# Patient Record
Sex: Female | Born: 1995 | Hispanic: No | Marital: Single | Smoking: Former smoker
Health system: Southern US, Community
[De-identification: ages and names within clinical notes are randomized; demographics above are authoritative.]

## PROBLEM LIST (undated history)

## (undated) DIAGNOSIS — L309 Dermatitis, unspecified: Secondary | ICD-10-CM

## (undated) DIAGNOSIS — I2699 Other pulmonary embolism without acute cor pulmonale: Secondary | ICD-10-CM

## (undated) HISTORY — PX: SHOULDER ARTHROSCOPY: SHX128

---
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
## 2017-09-01 ENCOUNTER — Other Ambulatory Visit: Payer: Self-pay

## 2017-09-01 ENCOUNTER — Emergency Department (HOSPITAL_BASED_OUTPATIENT_CLINIC_OR_DEPARTMENT_OTHER)
Admission: EM | Admit: 2017-09-01 | Discharge: 2017-09-01 | Disposition: A | Discharge status: Home / Self Care | Payer: Worker's Compensation | Attending: Emergency Medicine | Admitting: Emergency Medicine

## 2017-09-01 ENCOUNTER — Encounter (HOSPITAL_BASED_OUTPATIENT_CLINIC_OR_DEPARTMENT_OTHER): Payer: Self-pay | Admitting: Emergency Medicine

## 2017-09-01 ENCOUNTER — Emergency Department (HOSPITAL_BASED_OUTPATIENT_CLINIC_OR_DEPARTMENT_OTHER): Payer: Worker's Compensation

## 2017-09-01 DIAGNOSIS — Z79899 Other long term (current) drug therapy: Secondary | ICD-10-CM | POA: Insufficient documentation

## 2017-09-01 DIAGNOSIS — M79641 Pain in right hand: Secondary | ICD-10-CM

## 2017-09-01 MED ORDER — ACETAMINOPHEN 325 MG PO TABS
650.0000 mg | ORAL_TABLET | Freq: Once | ORAL | Status: AC
  Administered 2017-09-01: 650 mg via ORAL
  Filled 2017-09-01: qty 2

## 2017-09-01 MED ORDER — NAPROXEN 375 MG PO TABS: 375.0000 mg | ORAL_TABLET | Freq: Two times a day (BID) | ORAL | 0 refills | Status: DC

## 2017-09-01 NOTE — ED Triage Notes (Signed)
R hand and wrist pain/swelling since Saturday. No known injury. Pt has velcro splint in place.

## 2017-09-01 NOTE — Discharge Instructions (Signed)
Your x-ray shows no bony abnormality.  Please wear the splint for comfort. Please rest, ice, compress and elevated the affected body part to help with swelling and pain. Please take the Naproxen as prescribed for pain. Do not take any additional NSAIDs including Motrin, Aleve, Ibuprofen, Advil. Follow up with pcp or ortho if symptoms are not improving. Return to the ED if your symptoms worsen.

## 2017-09-01 NOTE — ED Provider Notes (Signed)
[facility] EMERGENCY DEPARTMENT Provider Note   CSN: [id number] Arrival date & time: 09/01/17  1052     History   Chief Complaint Chief Complaint  Patient presents with  . Hand Pain    HPI Kelly Lyons is a 21 y.o. female.  HPI 21 year old female with no pertinent past medical history presents to the emergency department today with complaints of right hand pain and wrist pain.  Patient reports associated swelling.  States that she noticed this today.  Patient denies any known trauma.  She does states that she works and does a lot of repetitive motions with her right hand.  States that the machine at work is broken so she has been doing a lot more by hand stuff.  Patient saw the nurse at the facility who put a splint on her and sent her to the ED for evaluation.  Patient reports some intermittent tingling.  Denies any redness, wound, fevers.  No history of same.  Patient took ibuprofen this morning for the pain.  Palpation of movement makes the pain worse.  States that it is hard to grip something due to the pain. Past Medical History:  Diagnosis Date  . Eczema    on face    There are no active problems to display for this patient.   Past Surgical History:  Procedure Laterality Date  . SHOULDER ARTHROSCOPY  December   For severely dislocated shoulder    OB History    No data available       Home Medications    Prior to Admission medications   Medication Sig Start Date End Date Taking? Authorizing Provider  HYDROcodone-acetaminophen (NORCO) 5-325 MG per tablet Take 1 tablet by mouth every 6 (six) hours as needed. For pain    [provider]  ketorolac (TORADOL) 10 MG tablet Take 10 mg by mouth every 6 (six) hours as needed. For pain    [provider]  medroxyPROGESTERone (DEPO-PROVERA) 150 MG/ML injection Inject 150 mg into the muscle every 3 (three) months.    [provider]  methocarbamol (ROBAXIN) 500 MG tablet Take 500  mg by mouth 2 (two) times daily as needed. For muscle relaxant    [provider]  mometasone (ELOCON) 0.1 % cream Apply 1 application topically daily. Applied to affected areas on body    [provider]  naproxen (NAPROSYN) 375 MG tablet Take 1 tablet (375 mg total) 2 (two) times daily by mouth. 09/01/17   Leaphart, Lynann BeaverKenneth T, PA-C  olopatadine (PATANOL) 0.1 % ophthalmic solution Place 1 drop into both eyes 2 (two) times daily.    [provider]  pimecrolimus (ELIDEL) 1 % cream Apply 1 application topically 2 (two) times daily. Applied to face    [provider]    Family History No family history on file.  Social History Social History   Tobacco Use  . Smoking status: Never Smoker  . Smokeless tobacco: Never Used  Substance Use Topics  . Alcohol use: No  . Drug use: No     Allergies   Patient has no known allergies.   Review of Systems Review of Systems  Musculoskeletal: Positive for arthralgias, joint swelling and myalgias.  Skin: Negative for color change and wound.  Neurological: Negative for weakness and numbness.     Physical Exam Updated Vital Signs BP 107/69 (BP Location: Left Arm)   Pulse 66   Temp 98.4 F (36.9 C) (Oral)   Resp 18   Ht  5' (1.524 m)   Wt 56.1 kg (123 lb 10.9 oz)   SpO2 100%   BMI 24.15 kg/m   Physical Exam  Constitutional: She appears well-developed and well-nourished. No distress.  HENT:  Head: Normocephalic and atraumatic.  Eyes: Right eye exhibits no discharge. Left eye exhibits no discharge. No scleral icterus.  Neck: Normal range of motion.  Pulmonary/Chest: No respiratory distress.  Musculoskeletal: Normal range of motion.  right hand with  tenderness to palpation of the thenar emanance.  No sig swelling appreciated. There is no joint effusion noted. Full ROM without pain. Decreased ROM 2/2 pain. No TTP over flexor sheath. No erythema or warmth overlaying the joint. There is no anatomic snuff  box tenderness. Normal sensation and motor function in the median, ulnar, and radial nerve distributions. 2+ radial pulse.  Grip 5/5 strength. MCP flexion/extension intact. Finger adduction/abduction intact with 5/5 strength.  Thumb opposition intact.   Neurological: She is alert.  Skin: Skin is warm and dry. Capillary refill takes less than 2 seconds. No pallor.  Psychiatric: Her behavior is normal. Judgment and thought content normal.  Nursing note and vitals reviewed.    ED Treatments / Results  Labs (all labs ordered are listed, but only abnormal results are displayed) Labs Reviewed - No data to display  EKG  EKG Interpretation None       Radiology Dg Hand Complete Right  Result Date: 09/01/2017 CLINICAL DATA:  Soft tissue swelling EXAM: RIGHT HAND - COMPLETE 3+ VIEW COMPARISON:  None. FINDINGS: Frontal, oblique, and lateral views were obtained. There is soft tissue swelling along the volar aspect of the hand. No fracture or dislocation. Joint spaces appear normal. No erosive change. IMPRESSION: Soft tissue swelling. No fracture or dislocation. No evident arthropathic change. Electronically Signed   By: Bretta Bang III M.D.   On: 09/01/2017 12:00    Procedures Procedures (including critical care time)  Medications Ordered in ED Medications  acetaminophen (TYLENOL) tablet 650 mg (650 mg Oral Given 09/01/17 1147)     Initial Impression / Assessment and Plan / ED Course  I have reviewed the triage vital signs and the nursing notes.  Pertinent labs & imaging results that were available during my care of the patient were reviewed by me and considered in my medical decision making (see chart for details).     Patient presents to the ED with complaints of right hand swelling and pain.  Patient is neurovascularly intact.  She has no associated erythema or tenderness over the flexor tendon.  I do not appreciate any significant swelling to the hand compared to the left.   Patient has full range of motion.  Patient does a lot of repetitive motions with her hands and could be overuse injury.  I have given her a splint and encouraged rice therapy at home.  Encouraged follow with orthopedics.  Possible carpal tunnel syndrome.  Pt is hemodynamically stable, in NAD, & able to ambulate in the ED. Evaluation does not show pathology that would require ongoing emergent intervention or inpatient treatment. I explained the diagnosis to the patient. Pain has been managed & has no complaints prior to dc. Pt is comfortable with above plan and is stable for discharge at this time. All questions were answered prior to disposition. Strict return precautions for f/u to the ED were discussed. Encouraged follow up with PCP.   Final Clinical Impressions(s) / ED Diagnoses   Final diagnoses:  Right hand pain    ED Discharge Orders  Ordered    naproxen (NAPROSYN) 375 MG tablet  2 times daily     09/01/17 1250       Wallace KellerLeaphart, Kenneth T, PA-C 09/01/17 1304    Mesner, Barbara CowerJason, MD 09/02/17 [phone]

## 2017-11-05 ENCOUNTER — Encounter (HOSPITAL_COMMUNITY): Payer: Self-pay | Admitting: Emergency Medicine

## 2017-11-05 ENCOUNTER — Ambulatory Visit (HOSPITAL_COMMUNITY)
Admission: EM | Admit: 2017-11-05 | Discharge: 2017-11-05 | Disposition: A | Discharge status: Home / Self Care | Payer: BLUE CROSS/BLUE SHIELD | Attending: Internal Medicine | Admitting: Internal Medicine

## 2017-11-05 ENCOUNTER — Other Ambulatory Visit: Payer: Self-pay

## 2017-11-05 DIAGNOSIS — J029 Acute pharyngitis, unspecified: Secondary | ICD-10-CM | POA: Insufficient documentation

## 2017-11-05 DIAGNOSIS — Z79899 Other long term (current) drug therapy: Secondary | ICD-10-CM | POA: Insufficient documentation

## 2017-11-05 LAB — POCT RAPID STREP A: STREPTOCOCCUS, GROUP A SCREEN (DIRECT): NEGATIVE

## 2017-11-05 MED ORDER — CETIRIZINE-PSEUDOEPHEDRINE ER 5-120 MG PO TB12: 1.0000 | ORAL_TABLET | Freq: Every day | ORAL | 0 refills | Status: DC

## 2017-11-05 MED ORDER — BENZONATATE 100 MG PO CAPS: 100.0000 mg | ORAL_CAPSULE | Freq: Three times a day (TID) | ORAL | 0 refills | Status: DC

## 2017-11-05 MED ORDER — FLUTICASONE PROPIONATE 50 MCG/ACT NA SUSP: 2.0000 | Freq: Every day | NASAL | 0 refills | Status: DC

## 2017-11-05 MED ORDER — PHENOL 1.4 % MT LIQD: 1.0000 | OROMUCOSAL | 0 refills | Status: DC | PRN

## 2017-11-05 NOTE — ED Provider Notes (Signed)
[facility]    CSN: [id number] Arrival date & time: 11/05/17  1521     History   Chief Complaint Chief Complaint  Patient presents with  . Sore Throat    HPI Kelly Lyons is a 22 y.o. female.   22 year old female comes in for 1 week history of sore throat. States she was seen at another urgent care with a negative strep, but wanted to be rechecked. She states started out with swollen tonsils with white patches. Has had some productive cough with 1 episode of post tussive vomiting. Denies rhinorrhea, nasal congestion. Denies fever, chills, night sweats. States she was given a dose of oral prednisone in office at the urgent care with some improvement. Has not tried anything else for the symptoms. Denies history of mono. Never smoker.       Past Medical History:  Diagnosis Date  . Eczema    on face    There are no active problems to display for this patient.   Past Surgical History:  Procedure Laterality Date  . SHOULDER ARTHROSCOPY  December   For severely dislocated shoulder    OB History    No data available       Home Medications    Prior to Admission medications   Medication Sig Start Date End Date Taking? Authorizing Provider  benzonatate (TESSALON) 100 MG capsule Take 1 capsule (100 mg total) by mouth every 8 (eight) hours. 11/05/17   Cathie Hoops, Alyissa Whidbee V, PA-C  cetirizine-pseudoephedrine (ZYRTEC-D) 5-120 MG tablet Take 1 tablet by mouth daily. 11/05/17   Cathie Hoops, Daiwik Buffalo V, PA-C  fluticasone (FLONASE) 50 MCG/ACT nasal spray Place 2 sprays into both nostrils daily. 11/05/17   Belinda Fisher, PA-C  HYDROcodone-acetaminophen (NORCO) 5-325 MG per tablet Take 1 tablet by mouth every 6 (six) hours as needed. For pain    [provider]  ketorolac (TORADOL) 10 MG tablet Take 10 mg by mouth every 6 (six) hours as needed. For pain    [provider]  medroxyPROGESTERone (DEPO-PROVERA) 150 MG/ML injection Inject 150 mg into the muscle every 3 (three) months.     [provider]  methocarbamol (ROBAXIN) 500 MG tablet Take 500 mg by mouth 2 (two) times daily as needed. For muscle relaxant    [provider]  mometasone (ELOCON) 0.1 % cream Apply 1 application topically daily. Applied to affected areas on body    [provider]  naproxen (NAPROSYN) 375 MG tablet Take 1 tablet (375 mg total) 2 (two) times daily by mouth. Patient not taking: Reported on 11/05/2017 09/01/17   Demetrios Loll T, PA-C  olopatadine (PATANOL) 0.1 % ophthalmic solution Place 1 drop into both eyes 2 (two) times daily.    [provider]  phenol (CHLORASEPTIC) 1.4 % LIQD Use as directed 1 spray in the mouth or throat as needed for throat irritation / pain. 11/05/17   Cathie Hoops, Kavitha Lansdale V, PA-C  pimecrolimus (ELIDEL) 1 % cream Apply 1 application topically 2 (two) times daily. Applied to face    [provider]    Family History No family history on file.  Social History Social History   Tobacco Use  . Smoking status: Never Smoker  . Smokeless tobacco: Never Used  Substance Use Topics  . Alcohol use: No  . Drug use: No     Allergies   Patient has no known allergies.   Review of Systems Review of Systems  Reason unable to perform ROS: See HPI as  above.     Physical Exam Triage Vital Signs ED Triage Vitals  Enc Vitals Group     BP 11/05/17 1535 (!) 113/56     Pulse Rate 11/05/17 1535 66     Resp 11/05/17 1535 14     Temp 11/05/17 1535 98.5 F (36.9 C)     Temp src --      SpO2 11/05/17 1535 100 %     Weight --      Height --      Head Circumference --      Peak Flow --      Pain Score 11/05/17 1537 7     Pain Loc --      Pain Edu? --      Excl. in GC? --    No data found.  Updated Vital Signs BP (!) 113/56   Pulse 66   Temp 98.5 F (36.9 C)   Resp 14   SpO2 100%   Physical Exam  Constitutional: She is oriented to person, place, and time. She appears well-developed and well-nourished. No distress.  HENT:    Head: Normocephalic and atraumatic.  Right Ear: External ear and ear canal normal. Tympanic membrane is erythematous. Tympanic membrane is not bulging.  Left Ear: External ear and ear canal normal. Tympanic membrane is erythematous. Tympanic membrane is not bulging.  Nose: Nose normal. Right sinus exhibits no maxillary sinus tenderness and no frontal sinus tenderness. Left sinus exhibits no maxillary sinus tenderness and no frontal sinus tenderness.  Mouth/Throat: Uvula is midline and mucous membranes are normal. Posterior oropharyngeal erythema present. Tonsils are 1+ on the right. Tonsils are 1+ on the left. No tonsillar exudate.  Eyes: Conjunctivae are normal. Pupils are equal, round, and reactive to light.  Neck: Normal range of motion. Neck supple.  Cardiovascular: Normal rate, regular rhythm and normal heart sounds. Exam reveals no gallop and no friction rub.  No murmur heard. Pulmonary/Chest: Effort normal and breath sounds normal. She has no decreased breath sounds. She has no wheezes. She has no rhonchi. She has no rales.  Lymphadenopathy:    She has no cervical adenopathy.  Neurological: She is alert and oriented to person, place, and time.  Skin: Skin is warm and dry.  Psychiatric: She has a normal mood and affect. Her behavior is normal. Judgment normal.     UC Treatments / Results  Labs (all labs ordered are listed, but only abnormal results are displayed) Labs Reviewed  CULTURE, GROUP A STREP [facility])  POCT RAPID STREP A    EKG  EKG Interpretation None       Radiology No results found.  Procedures Procedures (including critical care time)  Medications Ordered in UC Medications - No data to display   Initial Impression / Assessment and Plan / UC Course  I have reviewed the triage vital signs and the nursing notes.  Pertinent labs & imaging results that were available during my care of the patient were reviewed by me and considered in my medical decision  making (see chart for details).    Rapid strep negative. Discussed post nasal drainage as possible cause of sore throat. Symptomatic treatment as needed. Return precautions given. Patient expresses understanding and agrees to plan.    Final Clinical Impressions(s) / UC Diagnoses   Final diagnoses:  Acute pharyngitis, unspecified etiology    ED Discharge Orders        Ordered    benzonatate (TESSALON) 100 MG capsule  Every 8 hours  11/05/17 1652    cetirizine-pseudoephedrine (ZYRTEC-D) 5-120 MG tablet  Daily     11/05/17 1652    fluticasone (FLONASE) 50 MCG/ACT nasal spray  Daily     11/05/17 1652    phenol (CHLORASEPTIC) 1.4 % LIQD  As needed     11/05/17 1652        Belinda Fisher, PA-C 11/05/17 1658

## 2017-11-05 NOTE — Discharge Instructions (Signed)
Rapid strep negative. Symptoms are most likely due to viral illness. Start phenol for sore throat. Tessalon for cough. Start flonase, zyrtec-D for nasal congestion. You can use over the counter nasal saline rinse such as neti pot for nasal congestion. Keep hydrated, your urine should be clear to pale yellow in color. Tylenol/motrin for fever and pain. Monitor for any worsening of symptoms, chest pain, shortness of breath, wheezing, swelling of the throat, follow up for reevaluation.  ° °For sore throat try using a honey-based tea. Use 3 teaspoons of honey with juice squeezed from half lemon. Place shaved pieces of ginger into 1/2-1 cup of water and warm over stove top. Then mix the ingredients and repeat every 4 hours as needed. ° °

## 2017-11-05 NOTE — ED Triage Notes (Signed)
Pt c/o sore throat x1 week, had swab done a week ago for strep was neg, states she wants to be checked again.

## 2017-11-07 LAB — CULTURE, GROUP A STREP (THRC)

## 2018-10-23 NOTE — L&D Delivery Note (Signed)
[facility] Delivery Note  Kelly Lyons is a 23 y.o. G1P1s/p SVD at [redacted]w[redacted]d. She was admitted for SOL  ROM: 12h 84m with clear fluid GBS Status: positive   Max  Labor Progress: . Patient presented to L&D for SOL. Initial SVE: 6/90/0. She then progressed to complete.   Delivery Date/Time: SOL Delivery: Called to room and patient was complete and pushing. Head delivered LOA. No nuchal cord present. Shoulder and body delivered in usual fashion. Infant with spontaneous cry, placed on mother's abdomen, dried and stimulated. Cord clamped x 2 after 1-minute delay, and cut by family member. Placenta delivered spontaneously with gentle cord traction. Fundus firm with massage and Pitocin. Labia, perineum, vagina, and cervix inspected inspected with  No lacerations.   Baby Weight: @BABYWGTOZEBC @ pending   Placenta: Sent to L&D Complications: None Lacerations: None EBL: 50 Analgesia: Epidural  Infant: APGAR (1 MIN):   APGAR (5 MINS):   APGAR (10 MINS):     [name], MD, PGY1  [facility] [place] 08/12/2019, 10:32 AM

## 2019-06-26 ENCOUNTER — Emergency Department (HOSPITAL_COMMUNITY): Payer: Medicaid Other

## 2019-06-26 ENCOUNTER — Emergency Department (HOSPITAL_COMMUNITY)
Admission: EM | Admit: 2019-06-26 | Discharge: 2019-06-26 | Disposition: A | Discharge status: Home / Self Care | Payer: Medicaid Other | Attending: Emergency Medicine | Admitting: Emergency Medicine

## 2019-06-26 ENCOUNTER — Other Ambulatory Visit: Payer: Self-pay

## 2019-06-26 ENCOUNTER — Encounter (HOSPITAL_COMMUNITY): Payer: Self-pay | Admitting: *Deleted

## 2019-06-26 DIAGNOSIS — R059 Cough, unspecified: Secondary | ICD-10-CM

## 2019-06-26 DIAGNOSIS — O26893 Other specified pregnancy related conditions, third trimester: Secondary | ICD-10-CM | POA: Insufficient documentation

## 2019-06-26 DIAGNOSIS — R05 Cough: Secondary | ICD-10-CM | POA: Diagnosis not present

## 2019-06-26 DIAGNOSIS — R112 Nausea with vomiting, unspecified: Secondary | ICD-10-CM | POA: Insufficient documentation

## 2019-06-26 DIAGNOSIS — Z20828 Contact with and (suspected) exposure to other viral communicable diseases: Secondary | ICD-10-CM | POA: Insufficient documentation

## 2019-06-26 DIAGNOSIS — J069 Acute upper respiratory infection, unspecified: Secondary | ICD-10-CM | POA: Diagnosis not present

## 2019-06-26 DIAGNOSIS — Z3A30 30 weeks gestation of pregnancy: Secondary | ICD-10-CM | POA: Diagnosis not present

## 2019-06-26 LAB — COMPREHENSIVE METABOLIC PANEL
ALT: 11 U/L (ref 0–44)
AST: 20 U/L (ref 15–41)
Albumin: 3.2 g/dL — ABNORMAL LOW (ref 3.5–5.0)
Alkaline Phosphatase: 163 U/L — ABNORMAL HIGH (ref 38–126)
Anion gap: 14 (ref 5–15)
BUN: <5 mg/dL — ABNORMAL LOW (ref 6–20)
CO2: 18 mmol/L — ABNORMAL LOW (ref 22–32)
Calcium: 9.4 mg/dL (ref 8.9–10.3)
Chloride: 103 mmol/L (ref 98–111)
Creatinine, Ser: 0.73 mg/dL (ref 0.44–1.00)
GFR calc Af Amer: >60 mL/min (ref >60)
GFR calc non Af Amer: >60 mL/min (ref >60)
Glucose, Bld: 69 mg/dL — ABNORMAL LOW (ref 70–99)
Potassium: 3.5 mmol/L (ref 3.5–5.1)
Sodium: 135 mmol/L (ref 135–145)
Total Bilirubin: 0.5 mg/dL (ref 0.3–1.2)
Total Protein: 7.5 g/dL (ref 6.5–8.1)

## 2019-06-26 LAB — CBC WITH DIFFERENTIAL/PLATELET
Abs Immature Granulocytes: 0.05 10*3/uL (ref 0.00–0.07)
Basophils Absolute: 0 10*3/uL (ref 0.0–0.1)
Basophils Relative: 0 %
Eosinophils Absolute: 0.3 10*3/uL (ref 0.0–0.5)
Eosinophils Relative: 2 %
HCT: 37 % (ref 36.0–46.0)
Hemoglobin: 13.3 g/dL (ref 12.0–15.0)
Immature Granulocytes: 0 %
Lymphocytes Relative: 15 %
Lymphs Abs: 2.1 10*3/uL (ref 0.7–4.0)
MCH: 28.9 pg (ref 26.0–34.0)
MCHC: 35.9 g/dL (ref 30.0–36.0)
MCV: 80.4 fL (ref 80.0–100.0)
Monocytes Absolute: 1 10*3/uL (ref 0.1–1.0)
Monocytes Relative: 7 %
Neutro Abs: 10.7 10*3/uL — ABNORMAL HIGH (ref 1.7–7.7)
Neutrophils Relative %: 76 %
Platelets: 276 10*3/uL (ref 150–400)
RBC: 4.6 MIL/uL (ref 3.87–5.11)
RDW: 12 % (ref 11.5–15.5)
WBC: 14.2 10*3/uL — ABNORMAL HIGH (ref 4.0–10.5)
nRBC: 0 % (ref 0.0–0.2)

## 2019-06-26 LAB — URINALYSIS, ROUTINE W REFLEX MICROSCOPIC
Bilirubin Urine: NEGATIVE
Glucose, UA: NEGATIVE mg/dL
Hgb urine dipstick: NEGATIVE
Ketones, ur: 80 mg/dL — AB
Nitrite: NEGATIVE
Protein, ur: 30 mg/dL — AB
Specific Gravity, Urine: 1.025 (ref 1.005–1.030)
pH: 6 (ref 5.0–8.0)

## 2019-06-26 LAB — LIPASE, BLOOD: Lipase: 29 U/L (ref 11–51)

## 2019-06-26 LAB — LACTIC ACID, PLASMA
Lactic Acid, Venous: 1.1 mmol/L (ref 0.5–1.9)
Lactic Acid, Venous: 1.2 mmol/L (ref 0.5–1.9)

## 2019-06-26 LAB — SARS CORONAVIRUS 2 BY RT PCR (HOSPITAL ORDER, PERFORMED IN ~~LOC~~ HOSPITAL LAB): SARS Coronavirus 2: NEGATIVE

## 2019-06-26 LAB — CBG MONITORING, ED: Glucose-Capillary: 68 mg/dL — ABNORMAL LOW (ref 70–99)

## 2019-06-26 LAB — GROUP A STREP BY PCR: Group A Strep by PCR: NOT DETECTED

## 2019-06-26 MED ORDER — ALBUTEROL SULFATE HFA 108 (90 BASE) MCG/ACT IN AERS
4.0000 | INHALATION_SPRAY | Freq: Once | RESPIRATORY_TRACT | Status: AC
  Administered 2019-06-26: 17:00:00 4 via RESPIRATORY_TRACT
  Filled 2019-06-26: qty 6.7

## 2019-06-26 MED ORDER — FAMOTIDINE 20 MG PO TABS: 20.0000 mg | ORAL_TABLET | Freq: Two times a day (BID) | ORAL | 0 refills | Status: DC

## 2019-06-26 MED ORDER — SODIUM CHLORIDE 0.9 % IV SOLN
1.0000 g | Freq: Once | INTRAVENOUS | Status: AC
  Administered 2019-06-26: 1 g via INTRAVENOUS
  Filled 2019-06-26: qty 10

## 2019-06-26 MED ORDER — METOCLOPRAMIDE HCL 5 MG PO TABS: 5.0000 mg | ORAL_TABLET | Freq: Four times a day (QID) | ORAL | 0 refills | Status: DC

## 2019-06-26 MED ORDER — DIPHENHYDRAMINE HCL 25 MG PO TABS: 25.0000 mg | ORAL_TABLET | Freq: Four times a day (QID) | ORAL | 0 refills | Status: DC

## 2019-06-26 MED ORDER — SODIUM CHLORIDE 0.9 % IV BOLUS (SEPSIS): 250.0000 mL | Freq: Once | INTRAVENOUS | Status: DC

## 2019-06-26 MED ORDER — FAMOTIDINE IN NACL 20-0.9 MG/50ML-% IV SOLN
20.0000 mg | Freq: Once | INTRAVENOUS | Status: AC
  Administered 2019-06-26: 20 mg via INTRAVENOUS
  Filled 2019-06-26: qty 50

## 2019-06-26 MED ORDER — DIPHENHYDRAMINE HCL 50 MG/ML IJ SOLN
25.0000 mg | Freq: Once | INTRAMUSCULAR | Status: AC
  Administered 2019-06-26: 25 mg via INTRAVENOUS
  Filled 2019-06-26: qty 1

## 2019-06-26 MED ORDER — ACETAMINOPHEN 500 MG PO TABS
1000.0000 mg | ORAL_TABLET | Freq: Once | ORAL | Status: AC
  Administered 2019-06-26: 1000 mg via ORAL
  Filled 2019-06-26: qty 2

## 2019-06-26 MED ORDER — SODIUM CHLORIDE 0.9 % IV SOLN: 1000.0000 mL | INTRAVENOUS | Status: DC

## 2019-06-26 MED ORDER — METOCLOPRAMIDE HCL 5 MG/ML IJ SOLN
5.0000 mg | Freq: Once | INTRAMUSCULAR | Status: AC
  Administered 2019-06-26: 5 mg via INTRAVENOUS
  Filled 2019-06-26: qty 2

## 2019-06-26 MED ORDER — SODIUM CHLORIDE 0.9 % IV BOLUS
1000.0000 mL | Freq: Once | INTRAVENOUS | Status: AC
  Administered 2019-06-26: 1000 mL via INTRAVENOUS

## 2019-06-26 MED ORDER — LACTATED RINGERS IV BOLUS
1000.0000 mL | Freq: Once | INTRAVENOUS | Status: AC
  Administered 2019-06-26: 1000 mL via INTRAVENOUS

## 2019-06-26 MED ORDER — CEPHALEXIN 500 MG PO CAPS: 1000.0000 mg | ORAL_CAPSULE | Freq: Two times a day (BID) | ORAL | 0 refills | Status: DC

## 2019-06-26 NOTE — ED Notes (Signed)
CBG noted to be 68. OJ and [place] sandwich given to pt and RN notified.

## 2019-06-26 NOTE — Discharge Instructions (Addendum)
1.  Start Keflex tomorrow evening. 2.  Take Pepcid twice daily and Reglan every 6 hours with Benadryl for nausea control for the next 24 hours then if needed. 3.  Continue to drink frequent small amount of fluids.  Try to eat very small amounts of food with some salt and sugar in it.  Eat bland foods and avoid things that trigger nausea. 4.  See your obstetrician as soon as possible for recheck. 5.  Return to the emergency department if symptoms return or worsen.

## 2019-06-26 NOTE — ED Triage Notes (Addendum)
Pt reports recent cough and cold symptoms. Reports cough, sore throat, headache, n/v. Sent here to r/o covid. Pt is approx [redacted] weeks pregnant.

## 2019-06-26 NOTE — ED Notes (Signed)
States she is 30 wks and 2 days was suppose to see her OB today however co nasal congestion , headache , vomiting for several days , c/o cough when she takes a deep breath, c/o sore throat.

## 2019-06-26 NOTE — ED Provider Notes (Signed)
[facility] Provider Note   CSN: [id number] Arrival date & time: 06/26/19  1208     History   Chief Complaint Chief Complaint  Patient presents with  . Cough  . Emesis  . Sore Throat    HPI Kelly Lyons is a 23 y.o. female.     HPI Patient is [redacted] weeks pregnant.  She reports that for the past 2 to 3 days now she has been getting a cough and congestion.  First symptom was sore throat.  That then progressed to have nasal congestion postnasal drainage followed by very harsh coughing.  Patient reports that she takes a deep breath she will go into a coughing episode that she cannot stop.  Sometimes is causing gagging and vomiting.  She reports when she coughs she feels achy around the lower ribs on both sides.  She reports she has vomited several times.  No diarrhea.  No abdominal pain.  No vaginal discharge or bleeding.  No pain burning urgency with urination.  She reports she does feel generally achy and fatigued.  No fever that she is documented.  Patient reports she is otherwise healthy.  She does has no history of asthma or other respiratory illnesses.  Patient reports that she had called her doctor about her symptoms today and they instructed her to come to the emergency department for COVID test.  She is also concerned because she really has not build to eat or drink for several days and worried that she is becoming dehydrated and that will negatively impact her pregnancy. Past Medical History:  Diagnosis Date  . Eczema    on face    There are no active problems to display for this patient.   Past Surgical History:  Procedure Laterality Date  . SHOULDER ARTHROSCOPY  December   For severely dislocated shoulder     OB History    Gravida  1   Para      Term      Preterm      AB      Living        SAB      TAB      Ectopic      Multiple      Live Births               Home Medications    Prior to Admission  medications   Medication Sig Start Date End Date Taking? Authorizing Provider  Prenatal 27-1 MG TABS Take 1 tablet by mouth daily. 03/28/19  Yes [provider]  benzonatate (TESSALON) 100 MG capsule Take 1 capsule (100 mg total) by mouth every 8 (eight) hours. Patient not taking: Reported on 06/26/2019 11/05/17   Belinda FisherYu, Amy V, PA-C  cephALEXin (KEFLEX) 500 MG capsule Take 2 capsules (1,000 mg total) by mouth 2 (two) times daily. 06/26/19   Arby BarrettePfeiffer, Krew Hortman, MD  cetirizine-pseudoephedrine (ZYRTEC-D) 5-120 MG tablet Take 1 tablet by mouth daily. Patient not taking: Reported on 06/26/2019 11/05/17   Belinda FisherYu, Amy V, PA-C  diphenhydrAMINE (BENADRYL) 25 MG tablet Take 1 tablet (25 mg total) by mouth every 6 (six) hours. 06/26/19   Arby BarrettePfeiffer, Leone Mobley, MD  famotidine (PEPCID) 20 MG tablet Take 1 tablet (20 mg total) by mouth 2 (two) times daily. 06/26/19   Arby BarrettePfeiffer, Stanly Si, MD  fluticasone (FLONASE) 50 MCG/ACT nasal spray Place 2 sprays into both nostrils daily. Patient not taking: Reported on 06/26/2019 11/05/17   Belinda FisherYu, Amy V, PA-C  metoCLOPramide (  REGLAN) 5 MG tablet Take 1 tablet (5 mg total) by mouth 4 (four) times daily. 06/26/19   Arby BarrettePfeiffer, Lucianna Ostlund, MD  naproxen (NAPROSYN) 375 MG tablet Take 1 tablet (375 mg total) 2 (two) times daily by mouth. Patient not taking: Reported on 11/05/2017 09/01/17   Demetrios LollLeaphart, Kenneth T, PA-C  phenol (CHLORASEPTIC) 1.4 % LIQD Use as directed 1 spray in the mouth or throat as needed for throat irritation / pain. Patient not taking: Reported on 06/26/2019 11/05/17   Lurline IdolYu, Amy V, PA-C    Family History No family history on file.  Social History Social History   Tobacco Use  . Smoking status: Never Smoker  . Smokeless tobacco: Never Used  Substance Use Topics  . Alcohol use: No  . Drug use: No     Allergies   Patient has no known allergies.   Review of Systems Review of Systems 10 Systems reviewed and are negative for acute change except as noted in the HPI.   Physical Exam  Updated Vital Signs BP (!) 96/51 (BP Location: Right Arm)   Pulse 82   Temp 97.8 F (36.6 C) (Oral)   Resp 18   LMP 11/27/2018   SpO2 97%   Physical Exam Constitutional:      Appearance: Normal appearance.  HENT:     Head: Normocephalic and atraumatic.     Mouth/Throat:     Mouth: Mucous membranes are moist.     Pharynx: Oropharynx is clear.     Comments: Very mild erythema of the tonsillar pillars.  No exudate or significant tonsillar enlargement. Eyes:     Extraocular Movements: Extraocular movements intact.     Conjunctiva/sclera: Conjunctivae normal.  Neck:     Musculoskeletal: Neck supple.  Cardiovascular:     Rate and Rhythm: Normal rate and regular rhythm.  Pulmonary:     Comments: No respiratory distress.  Patient is reluctant however to take deep inspiration because this precipitates cough paroxysmal.  Cough is harsh and dry.  Good airflow to the bases.  No gross rales or rhonchi. Abdominal:     Comments: Abdomen is gravid and nontender.  Musculoskeletal: Normal range of motion.        General: No swelling or tenderness.     Right lower leg: No edema.     Left lower leg: No edema.  Lymphadenopathy:     Cervical: No cervical adenopathy.  Skin:    General: Skin is warm and dry.     Findings: No rash.  Neurological:     General: No focal deficit present.     Mental Status: She is alert and oriented to person, place, and time.     Coordination: Coordination normal.  Psychiatric:        Mood and Affect: Mood normal.      ED Treatments / Results  Labs (all labs ordered are listed, but only abnormal results are displayed) Labs Reviewed  COMPREHENSIVE METABOLIC PANEL - Abnormal; Notable for the following components:      Result Value   CO2 18 (*)    Glucose, Bld 69 (*)    BUN <5 (*)    Albumin 3.2 (*)    Alkaline Phosphatase 163 (*)    All other components within normal limits  CBC WITH DIFFERENTIAL/PLATELET - Abnormal; Notable for the following  components:   WBC 14.2 (*)    Neutro Abs 10.7 (*)    All other components within normal limits  URINALYSIS, ROUTINE W REFLEX MICROSCOPIC - Abnormal;  Notable for the following components:   APPearance HAZY (*)    Ketones, ur 80 (*)    Protein, ur 30 (*)    Leukocytes,Ua MODERATE (*)    Bacteria, UA FEW (*)    All other components within normal limits  CBG MONITORING, ED - Abnormal; Notable for the following components:   Glucose-Capillary 68 (*)    All other components within normal limits  SARS CORONAVIRUS 2 (HOSPITAL ORDER, [facility])  GROUP A STREP BY PCR  URINE CULTURE  LIPASE, BLOOD  LACTIC ACID, PLASMA  LACTIC ACID, PLASMA    EKG None  Radiology Dg Chest Port 1 View  Result Date: 06/26/2019 CLINICAL DATA:  Cough, shortness of breath. EXAM: PORTABLE CHEST 1 VIEW COMPARISON:  Radiographs of December 04, 2011. FINDINGS: The heart size and mediastinal contours are within normal limits. Both lungs are clear. No pneumothorax or pleural effusion is noted. The visualized skeletal structures are unremarkable. IMPRESSION: No active disease. Electronically Signed   By: Marijo Conception M.D.   On: 06/26/2019 15:45    Procedures Procedures (including critical care time) Angiocath insertion Performed by: Charlesetta Shanks  Consent: Verbal consent obtained. Risks and benefits: risks, benefits and alternatives were discussed Time out: Immediately prior to procedure a "time out" was called to verify the correct patient, procedure, equipment, support staff and site/side marked as required.  Preparation: Patient was prepped and draped in the usual sterile fashion.  Vein Location: left basilic  Yes Ultrasound Guided  Gauge: 20  Normal blood return and flush without difficulty Patient tolerance: Patient tolerated the procedure well with no immediate complications.   Medications Ordered in ED Medications  acetaminophen (TYLENOL) tablet 1,000 mg (1,000 mg  Oral Given 06/26/19 1544)  albuterol (VENTOLIN HFA) 108 (90 Base) MCG/ACT inhaler 4 puff (4 puffs Inhalation Given 06/26/19 1647)  sodium chloride 0.9 % bolus 1,000 mL (0 mLs Intravenous Stopped 06/26/19 2113)  lactated ringers bolus 1,000 mL (0 mLs Intravenous Stopped 06/26/19 2232)  famotidine (PEPCID) IVPB 20 mg premix (0 mg Intravenous Stopped 06/26/19 2113)  diphenhydrAMINE (BENADRYL) injection 25 mg (25 mg Intravenous Given 06/26/19 1953)  metoCLOPramide (REGLAN) injection 5 mg (5 mg Intravenous Given 06/26/19 1953)  cefTRIAXone (ROCEPHIN) 1 g in sodium chloride 0.9 % 100 mL IVPB (0 g Intravenous Stopped 06/26/19 2315)     Initial Impression / Assessment and Plan / ED Course  I have reviewed the triage vital signs and the nursing notes.  Pertinent labs & imaging results that were available during my care of the patient were reviewed by me and considered in my medical decision making (see chart for details).  Clinical Course as of Jun 26 2323  Thu Jun 26, 2019  1758 Delay in fluid administration due to nursing difficulty in establishing peripheral IV.  I have placed ultrasound-guided IV with good blood return and flush.  Will initiate hydration.   [MP]  2233 Patient is feeling much better.  She is alert and comfortable.  She has had no further nausea or vomiting.  Has been able to tolerate small amount of applesauce and crackers and fluids.  She reports she is been up to go the bathroom multiple times.  She has not felt weak or dizzy.   [MP]    Clinical Course User Index [MP] Charlesetta Shanks, MD      Patient presented with upper respiratory symptoms consistent with viral illness.  She has tested negative for COVID and the chest x-ray is  clear.  At this time I do not suspect bacterial pneumonia.  Patient had good response to albuterol inhaler as far as coughing was concerned.  Nausea was treated with Reglan, Benadryl and Pepcid.  Patient was hydrated and improved significantly.  She has been able to  tolerate oral intake and has been up and ambulatory without difficulty.  At this time I do feel she is stable for discharge.  She will use her albuterol inhaler as provided, take Keflex for urine that is suggestive of UTI and Reglan and Benadryl.  Final Clinical Impressions(s) / ED Diagnoses   Final diagnoses:  Viral upper respiratory tract infection  Cough  Non-intractable vomiting with nausea, unspecified vomiting type  [redacted] weeks gestation of pregnancy    ED Discharge Orders         Ordered    cephALEXin (KEFLEX) 500 MG capsule  2 times daily     06/26/19 2319    famotidine (PEPCID) 20 MG tablet  2 times daily     06/26/19 2319    metoCLOPramide (REGLAN) 5 MG tablet  4 times daily     06/26/19 2319    diphenhydrAMINE (BENADRYL) 25 MG tablet  Every 6 hours     06/26/19 2319           Arby Barrette, MD 06/29/19 1357

## 2019-06-27 LAB — URINE CULTURE

## 2019-08-02 ENCOUNTER — Inpatient Hospital Stay (HOSPITAL_BASED_OUTPATIENT_CLINIC_OR_DEPARTMENT_OTHER): Payer: Medicaid Other

## 2019-08-02 ENCOUNTER — Inpatient Hospital Stay (HOSPITAL_COMMUNITY)
Admission: EM | Admit: 2019-08-02 | Discharge: 2019-08-06 | Disposition: A | Discharge status: Home / Self Care | Payer: Medicaid Other | Attending: Obstetrics and Gynecology | Admitting: Obstetrics and Gynecology

## 2019-08-02 ENCOUNTER — Encounter (HOSPITAL_COMMUNITY): Payer: Self-pay

## 2019-08-02 ENCOUNTER — Other Ambulatory Visit: Payer: Self-pay

## 2019-08-02 DIAGNOSIS — Z3A35 35 weeks gestation of pregnancy: Secondary | ICD-10-CM | POA: Insufficient documentation

## 2019-08-02 DIAGNOSIS — O4693 Antepartum hemorrhage, unspecified, third trimester: Secondary | ICD-10-CM

## 2019-08-02 DIAGNOSIS — O368130 Decreased fetal movements, third trimester, not applicable or unspecified: Secondary | ICD-10-CM | POA: Diagnosis not present

## 2019-08-02 DIAGNOSIS — Z20828 Contact with and (suspected) exposure to other viral communicable diseases: Secondary | ICD-10-CM | POA: Diagnosis not present

## 2019-08-02 DIAGNOSIS — O368190 Decreased fetal movements, unspecified trimester, not applicable or unspecified: Secondary | ICD-10-CM

## 2019-08-02 DIAGNOSIS — O368131 Decreased fetal movements, third trimester, fetus 1: Secondary | ICD-10-CM

## 2019-08-02 DIAGNOSIS — N930 Postcoital and contact bleeding: Secondary | ICD-10-CM

## 2019-08-02 LAB — CBC
HCT: 35.5 % — ABNORMAL LOW (ref 36.0–46.0)
Hemoglobin: 13 g/dL (ref 12.0–15.0)
MCH: 29.5 pg (ref 26.0–34.0)
MCHC: 36.6 g/dL — ABNORMAL HIGH (ref 30.0–36.0)
MCV: 80.5 fL (ref 80.0–100.0)
Platelets: 244 10*3/uL (ref 150–400)
RBC: 4.41 MIL/uL (ref 3.87–5.11)
RDW: 12.9 % (ref 11.5–15.5)
WBC: 9.4 10*3/uL (ref 4.0–10.5)
nRBC: 0 % (ref 0.0–0.2)

## 2019-08-02 LAB — WET PREP, GENITAL
Clue Cells Wet Prep HPF POC: NONE SEEN
Sperm: NONE SEEN
Trich, Wet Prep: NONE SEEN
Yeast Wet Prep HPF POC: NONE SEEN

## 2019-08-02 LAB — ABO/RH: ABO/RH(D): AB POS

## 2019-08-02 LAB — RAPID URINE DRUG SCREEN, HOSP PERFORMED
Amphetamines: NOT DETECTED
Barbiturates: NOT DETECTED
Benzodiazepines: NOT DETECTED
Cocaine: NOT DETECTED
Opiates: NOT DETECTED
Tetrahydrocannabinol: POSITIVE — AB

## 2019-08-02 LAB — TYPE AND SCREEN
ABO/RH(D): AB POS
Antibody Screen: NEGATIVE

## 2019-08-02 LAB — SARS CORONAVIRUS 2 (TAT 6-24 HRS): SARS Coronavirus 2: NEGATIVE

## 2019-08-02 MED ORDER — TERBUTALINE SULFATE 1 MG/ML IJ SOLN
0.2500 mg | Freq: Once | INTRAMUSCULAR | Status: AC
  Administered 2019-08-02: 0.25 mg via SUBCUTANEOUS
  Filled 2019-08-02: qty 1

## 2019-08-02 MED ORDER — LACTATED RINGERS IV SOLN
INTRAVENOUS | Status: DC
  Administered 2019-08-02 – 2019-08-03 (×2): via INTRAVENOUS

## 2019-08-02 MED ORDER — LACTATED RINGERS IV BOLUS
1000.0000 mL | Freq: Once | INTRAVENOUS | Status: AC
  Administered 2019-08-02: 15:00:00 1000 mL via INTRAVENOUS

## 2019-08-02 MED ORDER — LACTATED RINGERS IV SOLN
INTRAVENOUS | Status: DC
  Administered 2019-08-02: 16:00:00 via INTRAVENOUS

## 2019-08-02 MED ORDER — BETAMETHASONE SOD PHOS & ACET 6 (3-3) MG/ML IJ SUSP
12.0000 mg | Freq: Once | INTRAMUSCULAR | Status: AC
  Administered 2019-08-03: 12 mg via INTRAMUSCULAR
  Filled 2019-08-02: qty 2

## 2019-08-02 MED ORDER — DOCUSATE SODIUM 100 MG PO CAPS
100.0000 mg | ORAL_CAPSULE | Freq: Every day | ORAL | Status: DC
  Administered 2019-08-02 – 2019-08-06 (×4): 100 mg via ORAL
  Filled 2019-08-02 (×4): qty 1

## 2019-08-02 MED ORDER — BETAMETHASONE SOD PHOS & ACET 6 (3-3) MG/ML IJ SUSP
12.0000 mg | Freq: Once | INTRAMUSCULAR | Status: AC
  Administered 2019-08-02: 16:00:00 12 mg via INTRAMUSCULAR
  Filled 2019-08-02: qty 2

## 2019-08-02 MED ORDER — PRENATAL MULTIVITAMIN CH
1.0000 | ORAL_TABLET | Freq: Every day | ORAL | Status: DC
  Administered 2019-08-03 – 2019-08-06 (×4): 1 via ORAL
  Filled 2019-08-02 (×4): qty 1

## 2019-08-02 MED ORDER — ZOLPIDEM TARTRATE 5 MG PO TABS
5.0000 mg | ORAL_TABLET | Freq: Every evening | ORAL | Status: DC | PRN
  Administered 2019-08-02 – 2019-08-05 (×4): 5 mg via ORAL
  Filled 2019-08-02 (×4): qty 1

## 2019-08-02 MED ORDER — ACETAMINOPHEN 325 MG PO TABS
650.0000 mg | ORAL_TABLET | ORAL | Status: DC | PRN
  Administered 2019-08-04: 650 mg via ORAL
  Filled 2019-08-02: qty 2

## 2019-08-02 MED ORDER — CALCIUM CARBONATE ANTACID 500 MG PO CHEW: 2.0000 | CHEWABLE_TABLET | ORAL | Status: DC | PRN

## 2019-08-02 NOTE — MAU Note (Signed)
Kelly Lyons is a 23 y.o. at [redacted]w[redacted]d here in MAU reporting: states after intercourse she had some heavy bleeding, it pooled on the floor, dripped down her leg, and went in the toilet. . Also having some abdominal pain and reports that she has not felt baby move since the episode of bleeding.   Onset of complaint: today  Pain score: 7/10  Vitals:   08/02/19 1425  BP: 127/82  Pulse: 94  Resp: 18  Temp: 99 F (37.2 C)  SpO2: 98%      Lab orders placed from triage: none

## 2019-08-02 NOTE — H&P (Signed)
HPI   [name] is a 23 y.o. female G1P0 @ [redacted]w[redacted]d here in MAU complaining of heavy vaginal bleeding and abdominal cramping that started during intercourse today around 1300. She felt a large gush and then noticed blood leaking down to her legs. She immediatly came to the hospital and presented to [facility] ED. She was transferred here by ED Staff. She complains of decreased fetal movement since the episode of bleeding. She is receiving her care at the HD. She has had no problems in this pregnancy.           OB History    Gravida  1   Para      Term      Preterm      AB      Living        SAB      TAB      Ectopic      Multiple      Live Births                  Past Medical History:  Diagnosis Date  . Eczema    on face         Past Surgical History:  Procedure Laterality Date  . SHOULDER ARTHROSCOPY  December   For severely dislocated shoulder    History reviewed. No pertinent family history.  Social History       Tobacco Use  . Smoking status: Never Smoker  . Smokeless tobacco: Never Used  Substance Use Topics  . Alcohol use: No  . Drug use: No    Allergies: No Known Allergies  Medications Prior to Admission  Medication Sig Dispense Refill Last Dose  . benzonatate (TESSALON) 100 MG capsule Take 1 capsule (100 mg total) by mouth every 8 (eight) hours. (Patient not taking: Reported on 06/26/2019) 21 capsule 0   . cephALEXin (KEFLEX) 500 MG capsule Take 2 capsules (1,000 mg total) by mouth 2 (two) times daily. 28 capsule 0   . cetirizine-pseudoephedrine (ZYRTEC-D) 5-120 MG tablet Take 1 tablet by mouth daily. (Patient not taking: Reported on 06/26/2019) 15 tablet 0   . diphenhydrAMINE (BENADRYL) 25 MG tablet Take 1 tablet (25 mg total) by mouth every 6 (six) hours. 20 tablet 0   . famotidine (PEPCID) 20 MG tablet Take 1 tablet (20 mg total) by mouth 2 (two) times daily. 30 tablet 0   . fluticasone (FLONASE) 50 MCG/ACT nasal  spray Place 2 sprays into both nostrils daily. (Patient not taking: Reported on 06/26/2019) 1 g 0   . metoCLOPramide (REGLAN) 5 MG tablet Take 1 tablet (5 mg total) by mouth 4 (four) times daily. 30 tablet 0   . naproxen (NAPROSYN) 375 MG tablet Take 1 tablet (375 mg total) 2 (two) times daily by mouth. (Patient not taking: Reported on 11/05/2017) 20 tablet 0   . phenol (CHLORASEPTIC) 1.4 % LIQD Use as directed 1 spray in the mouth or throat as needed for throat irritation / pain. (Patient not taking: Reported on 06/26/2019) 1 Bottle 0   . Prenatal 27-1 MG TABS Take 1 tablet by mouth daily.      Lab Results Last 48 Hours        Results for orders placed or performed during the hospital encounter of 08/02/19 (from the past 48 hour(s))  Wet prep, genital     Status: Abnormal   Collection Time: 08/02/19  2:55 PM   Specimen: PATH Cytology Cervicovaginal Ancillary Only  Result Value Ref  Range   Yeast Wet Prep HPF POC NONE SEEN NONE SEEN   Trich, Wet Prep NONE SEEN NONE SEEN   Clue Cells Wet Prep HPF POC NONE SEEN NONE SEEN   WBC, Wet Prep HPF POC FEW (A) NONE SEEN   Sperm NONE SEEN     Comment: Performed at [facility], [address] [place], [place] [address]     Review of Systems  Gastrointestinal: Positive for abdominal pain.  Genitourinary: Positive for vaginal bleeding.   Physical Exam   Blood pressure 127/82, pulse 94, temperature 99 F (37.2 C), temperature source Oral, resp. rate 18, last menstrual period 11/27/2018, SpO2 98 %.  Physical Exam  Constitutional: She is oriented to person, place, and time. She appears well-developed and well-nourished.  HENT:  Head: Normocephalic.  GI: Soft.  Genitourinary:    Genitourinary Comments: Vagina - moderate amount of bright red blood in the vault, enough to saturate 2-3 large swabs.   Cervix - + oozing of blood from the cervix.  Bimanual exam: Cervix: Dilation: Fingertip Effacement (%): Thick Cervical  Position: Anterior Exam by:: rasch np Chaperone present for exam.    Musculoskeletal: Normal range of motion.  Neurological: She is alert and oriented to person, place, and time.   FHR- 130s reassuring CTX- irregular  A/P. Bleeding during pregnancy at 35.[redacted] weeks EGA BMZ given in the MAU and will be given again tomorrow She will be admitted for 7 days

## 2019-08-02 NOTE — MAU Note (Signed)
U/s at bedside

## 2019-08-02 NOTE — MAU Provider Note (Signed)
History     CSN: [id number]  Arrival date and time: 08/02/19 1412   First Provider Initiated Contact with Patient 08/02/19 1445      Chief Complaint  Patient presents with  . Abdominal Pain  . Vaginal Bleeding  . Decreased Fetal Movement   HPI   [name] is a 23 y.o. female G1P0 @ [redacted]w[redacted]d here in MAU complaining of heavy vaginal bleeding and abdominal cramping that started during intercourse today around 1300. She felt a large gush and then noticed blood leaking down to her legs. She immediatly came to the hospital and presented to [facility] ED. She was transferred here by ED Staff. She complains of decreased fetal movement since the episode of bleeding. She is receiving her care at the HD. She has had no problems in this pregnancy.    Of note: dried, bright red blood noted on patient's feet upon arrival.   OB History    Gravida  1   Para      Term      Preterm      AB      Living        SAB      TAB      Ectopic      Multiple      Live Births              Past Medical History:  Diagnosis Date  . Eczema    on face    Past Surgical History:  Procedure Laterality Date  . SHOULDER ARTHROSCOPY  December   For severely dislocated shoulder    History reviewed. No pertinent family history.  Social History   Tobacco Use  . Smoking status: Never Smoker  . Smokeless tobacco: Never Used  Substance Use Topics  . Alcohol use: No  . Drug use: No    Allergies: No Known Allergies  Medications Prior to Admission  Medication Sig Dispense Refill Last Dose  . benzonatate (TESSALON) 100 MG capsule Take 1 capsule (100 mg total) by mouth every 8 (eight) hours. (Patient not taking: Reported on 06/26/2019) 21 capsule 0   . cephALEXin (KEFLEX) 500 MG capsule Take 2 capsules (1,000 mg total) by mouth 2 (two) times daily. 28 capsule 0   . cetirizine-pseudoephedrine (ZYRTEC-D) 5-120 MG tablet Take 1 tablet by mouth daily. (Patient not taking: Reported on  06/26/2019) 15 tablet 0   . diphenhydrAMINE (BENADRYL) 25 MG tablet Take 1 tablet (25 mg total) by mouth every 6 (six) hours. 20 tablet 0   . famotidine (PEPCID) 20 MG tablet Take 1 tablet (20 mg total) by mouth 2 (two) times daily. 30 tablet 0   . fluticasone (FLONASE) 50 MCG/ACT nasal spray Place 2 sprays into both nostrils daily. (Patient not taking: Reported on 06/26/2019) 1 g 0   . metoCLOPramide (REGLAN) 5 MG tablet Take 1 tablet (5 mg total) by mouth 4 (four) times daily. 30 tablet 0   . naproxen (NAPROSYN) 375 MG tablet Take 1 tablet (375 mg total) 2 (two) times daily by mouth. (Patient not taking: Reported on 11/05/2017) 20 tablet 0   . phenol (CHLORASEPTIC) 1.4 % LIQD Use as directed 1 spray in the mouth or throat as needed for throat irritation / pain. (Patient not taking: Reported on 06/26/2019) 1 Bottle 0   . Prenatal 27-1 MG TABS Take 1 tablet by mouth daily.      Results for orders placed or performed during the hospital encounter of 08/02/19 (from the past  48 hour(s))  Wet prep, genital     Status: Abnormal   Collection Time: 08/02/19  2:55 PM   Specimen: PATH Cytology Cervicovaginal Ancillary Only  Result Value Ref Range   Yeast Wet Prep HPF POC NONE SEEN NONE SEEN   Trich, Wet Prep NONE SEEN NONE SEEN   Clue Cells Wet Prep HPF POC NONE SEEN NONE SEEN   WBC, Wet Prep HPF POC FEW (A) NONE SEEN   Sperm NONE SEEN     Comment: Performed at [facility], [address] [place], [place] [address]  Type and screen [facility]     Status: None (Preliminary result)   Collection Time: 08/02/19  3:05 PM  Result Value Ref Range   ABO/RH(D) PENDING    Antibody Screen PENDING    Sample Expiration      08/05/2019,2359 Performed at [facility], [address] [place], [place] [address]    Review of Systems  Gastrointestinal: Positive for abdominal pain.  Genitourinary: Positive for vaginal bleeding.   Physical Exam   Blood pressure 127/82, pulse 94,  temperature 99 F (37.2 C), temperature source Oral, resp. rate 18, last menstrual period 11/27/2018, SpO2 98 %.  Physical Exam  Constitutional: She is oriented to person, place, and time. She appears well-developed and well-nourished.  HENT:  Head: Normocephalic.  GI: Soft. There is generalized abdominal tenderness.  Genitourinary:    Genitourinary Comments: Vagina - moderate amount of bright red blood in the vault, enough to saturate 2-3 large swabs.   Cervix - + oozing of blood from the cervix.  Bimanual exam: Cervix: Dilation: Fingertip Effacement (%): Thick Cervical Position: Anterior Exam by:: Harry Bark np Chaperone present for exam.    Musculoskeletal: Normal range of motion.  Neurological: She is alert and oriented to person, place, and time.   MAU Course  Procedures  None  MDM  AB positive blood type  [place] and BPP at bedside; BPP 8/8 LR bolus Betamethasone given Type and screen  Discussed patient with Dr. Marice Potter, will admit for vaginal bleeding.  Category 1 fetal tracing, some variables initially, none now. Frequent contractions.   Assessment and Plan   A:  1. PCB (post coital bleeding)   2. Vaginal bleeding in pregnancy, third trimester   3. Decreased fetal movement   4. [redacted] weeks gestation of pregnancy     P:  Admit to OB speciality  Betamethasone given   Duane Lope, NP 08/02/2019 5:13 PM

## 2019-08-02 NOTE — MAU Note (Signed)
U/s paged, states she will be at the bedside in 10-15 min

## 2019-08-03 DIAGNOSIS — O368131 Decreased fetal movements, third trimester, fetus 1: Secondary | ICD-10-CM | POA: Diagnosis not present

## 2019-08-03 DIAGNOSIS — Z3A35 35 weeks gestation of pregnancy: Secondary | ICD-10-CM | POA: Diagnosis not present

## 2019-08-03 DIAGNOSIS — O4693 Antepartum hemorrhage, unspecified, third trimester: Secondary | ICD-10-CM | POA: Diagnosis not present

## 2019-08-03 NOTE — Progress Notes (Signed)
Patient ID: Kelly Lyons, female   DOB: 1996-06-10, 23 y.o.   MRN: [id number]  HD #2  S. She reports that the bleeding completely stopped yesterday around 7 pm. The baby is moving, no contractions now, no ROM.  O. VSS, AF FHR- reassuring Abd- benign, gravid U/S - done yesterday and unofficially "normal"  A/P. Bleeding at 35. [redacted] weeks EGA- rec admission for 5-7 days She will get her second dose of BMZ this evening

## 2019-08-03 NOTE — Progress Notes (Signed)
Notified Dr. Harolyn Rutherford about the variable that occurred in the NST strip at 1648. Repositioned pt on left side and pt recovered well. No new orders received at this time. Told that pt can come off the monitor after 10-20 more minutes of having a reactive strip.

## 2019-08-04 DIAGNOSIS — O4693 Antepartum hemorrhage, unspecified, third trimester: Secondary | ICD-10-CM

## 2019-08-04 DIAGNOSIS — Z3A35 35 weeks gestation of pregnancy: Secondary | ICD-10-CM

## 2019-08-04 DIAGNOSIS — O368131 Decreased fetal movements, third trimester, fetus 1: Secondary | ICD-10-CM | POA: Diagnosis not present

## 2019-08-04 MED ORDER — SODIUM CHLORIDE 0.9% FLUSH
3.0000 mL | Freq: Two times a day (BID) | INTRAVENOUS | Status: DC
  Administered 2019-08-04: 22:00:00 3 mL via INTRAVENOUS

## 2019-08-04 MED ORDER — SODIUM CHLORIDE 0.9% FLUSH: 3.0000 mL | INTRAVENOUS | Status: DC | PRN

## 2019-08-04 MED ORDER — SODIUM CHLORIDE 0.9 % IV SOLN: 250.0000 mL | INTRAVENOUS | Status: DC | PRN

## 2019-08-04 NOTE — Progress Notes (Signed)
Patient ID: Kelly Lyons, female   DOB: 1996/04/11, 23 y.o.   MRN: [id number] [place] NOTE  Kelly Lyons is a 23 y.o. G1P0 at [redacted]w[redacted]d  who is admitted for vaginal bleeding following intercourse.   Fetal presentation is cephalic by U/S 52/77/82 Length of Stay:  0  Days  Subjective: No complaints this morning. No more bleeding since  Admission. Tolerating diet. + FM Patient reports good fetal movement.  She reports no uterine contractions, no bleeding and no loss of fluid per vagina.  Vitals:  Blood pressure 107/61, pulse 60, temperature 98.2 F (36.8 C), temperature source Oral, resp. rate 16, height 5' 0.25" (1.53 m), weight 72.6 kg, last menstrual period 11/27/2018, SpO2 99 %.   Physical Examination: Lungs clear Heart RRR Abd soft + BS gravid non tender Ext non tender  Fetal Monitoring:  140-150's, + accels, no decels, occ ut ctx  Labs:  No results found for this or any previous visit (from the past 24 hour(s)).  Imaging Studies:    NA   Medications:  Scheduled . docusate sodium  100 mg Oral Daily  . prenatal multivitamin  1 tablet Oral Q1200   I have reviewed the patient's current medications.  ASSESSMENT: IUP 35 5/7 weeks Vaginal bleeding third trimester after IC  PLAN: Stable. No further bleeding since admission. S/P BMZ x 2  Will plan to monitor for 5 days Continue routine antenatal care.   Chancy Milroy 08/04/2019,9:40 AM

## 2019-08-04 NOTE — Plan of Care (Signed)
Patient and family made aware again of MD's plan for her to remain in the hospital for 7 days with any bleeding in the 3rd trimester.She voiced she understood this.

## 2019-08-05 DIAGNOSIS — Z3A35 35 weeks gestation of pregnancy: Secondary | ICD-10-CM | POA: Diagnosis not present

## 2019-08-05 DIAGNOSIS — O4693 Antepartum hemorrhage, unspecified, third trimester: Secondary | ICD-10-CM | POA: Diagnosis not present

## 2019-08-05 DIAGNOSIS — O368131 Decreased fetal movements, third trimester, fetus 1: Secondary | ICD-10-CM | POA: Diagnosis not present

## 2019-08-05 NOTE — Progress Notes (Signed)
Patient ID: Kelly Lyons, female   DOB: Jun 01, 1996, 23 y.o.   MRN: [id number] [place] NOTE  Kelly Lyons is a 23 y.o. G1P0 at [redacted]w[redacted]d  who is admitted for vaginal bleeding following intercourse.   Fetal presentation is cephalic by U/S 60/63/01 Length of Stay:  0  Days  Subjective: No complaints this morning. Denies VB or LOF. Tolerating diet. Up to restroom without problems. Patient reports good fetal movement.  She reports no uterine contractions, no bleeding and no loss of fluid per vagina.  Vitals:  Blood pressure (!) 109/50, pulse 69, temperature 98 F (36.7 C), temperature source Oral, resp. rate 18, height 5' 0.25" (1.53 m), weight 72.6 kg, last menstrual period 11/27/2018, SpO2 98 %.   Physical Examination: Lungs clear Heart RRR Abd soft + BS gravid non tender  Ext non tender  Fetal Monitoring:  130-140's, + accels, no decles, no ut ctx  Labs:  No results found for this or any previous visit (from the past 24 hour(s)).  Imaging Studies:    NA   Medications:  Scheduled . docusate sodium  100 mg Oral Daily  . prenatal multivitamin  1 tablet Oral Q1200  . sodium chloride flush  3 mL Intravenous Q12H   I have reviewed the patient's current medications.  ASSESSMENT: IUP 35 6/7 weeks Vaginal bleeding third trimester after IC  PLAN: Stable. Plan for discharge tomorrow if remains stable Continue routine antenatal care.   Chancy Milroy 08/05/2019,9:36 AM

## 2019-08-06 DIAGNOSIS — Z3A36 36 weeks gestation of pregnancy: Secondary | ICD-10-CM | POA: Diagnosis not present

## 2019-08-06 DIAGNOSIS — O4693 Antepartum hemorrhage, unspecified, third trimester: Secondary | ICD-10-CM | POA: Diagnosis not present

## 2019-08-06 MED ORDER — DOCUSATE SODIUM 100 MG PO CAPS: 100.0000 mg | ORAL_CAPSULE | Freq: Every day | ORAL | 2 refills | Status: DC

## 2019-08-06 NOTE — Discharge Instructions (Signed)
Vaginal Bleeding During Pregnancy, Third Trimester ° °A small amount of bleeding (spotting) from the vagina is common during pregnancy. Sometimes the bleeding is normal and is not a problem, and sometimes it is a sign of something serious. Tell your doctor about any bleeding from your vagina right away. °Follow these instructions at home: °Activity °· Follow your doctor's instructions about how active you can be. Your doctor may recommend that you: °? Stay in bed and only get up to use the bathroom. °? Continue light activity. °· If needed, make plans for someone to help you with your normal activities. °· Ask your doctor if it is safe for you to drive. °· Do not lift anything that is heavier than 10 lb (4.5 kg) until your doctor says that this is safe. °· Do not have sex or orgasms until your doctor says that this is safe. °Medicines °· Take over-the-counter and prescription medicines only as told by your doctor. °· Do not take aspirin. It can cause bleeding. °General instructions °· Watch your condition for any changes. °· Write down: °? The number of pads you use each day. °? How often you change pads. °? How soaked (saturated) your pads are. °· Do not use tampons. °· Do not douche. °· If you pass any tissue from your vagina, save the tissue to show your doctor. °· Keep all follow-up visits as told by your doctor. This is important. °Contact a doctor if: °· You have vaginal bleeding at any time during pregnancy. °· You have cramps. °· You have a fever. °Get help right away if: °· You have very bad cramps. °· You have very bad pain in your back or belly (abdomen). °· You have a gush of fluid from your vagina. °· You pass large clots or a lot of tissue from your vagina. °· Your bleeding gets worse. °· You feel light-headed or weak. °· You pass out (faint). °· Your baby is moving less than usual, or not moving at all. °Summary °· Tell your doctor about any bleeding from your vagina right away. °· Follow instructions  from your doctor about how active you can be. You may need someone to help you with your normal activities. °This information is not intended to replace advice given to you by your health care provider. Make sure you discuss any questions you have with your health care provider. °Document Released: 02/23/2014 Document Revised: 01/28/2019 Document Reviewed: 01/10/2017 °Elsevier Patient Education © 2020 [employer] ° °

## 2019-08-06 NOTE — Discharge Summary (Signed)
Patient ID: Kelly Lyons MRN: [id number] DOB/AGE: 14-Feb-1996 23 y.o.  Admit date: 08/02/2019 Discharge date: 08/06/2019  Admission Diagnoses: IUP 35 weeks, third trimester vaginal bleeding after intercourse  Discharge Diagnoses: SAA undelivered  Prenatal Procedures: NST, ultrasound and BMZ x 2  Consults: NA  Hospital Course:  This is a 23 y.o. G1P0 with IUP at [redacted]w[redacted]d admitted for above Dx. She was observed for 5 days and had no further vaginal bleeding. U/S revealed ant placenta, no abruption or previa. Received BMZ x 2 .    She was observed, fetal heart rate monitoring remained reassuring, and she had no signs/symptoms of progressing preterm labor or other maternal-fetal concerns.   She was deemed stable for discharge to home with outpatient follow up. Discharge instructions, follow up and medications reviewed with pt. Pt verbalized understanding.  Discharge Exam: Temp:  [98.4 F (36.9 C)-98.9 F (37.2 C)] 98.4 F (36.9 C) (10/14 0915) Pulse Rate:  [65-82] 66 (10/14 0915) Resp:  [18] 18 (10/14 0915) BP: (113-130)/(48-73) 130/72 (10/14 0915) SpO2:  [99 %-100 %] 100 % (10/14 0915)   Physical Examination: Lungs clear Heart RRR Abd soft + BS gravid non tender Ext non tender  Fetal monitoring: FHR: 130 bpm, Variability: moderate, Accelerations: Present, Decelerations: Absent  Uterine activity: no contractions per hour  Significant Diagnostic Studies:  Results for orders placed or performed during the hospital encounter of 08/02/19 (from the past 168 hour(s))  Wet prep, genital   Collection Time: 08/02/19  2:55 PM   Specimen: PATH Cytology Cervicovaginal Ancillary Only  Result Value Ref Range   Yeast Wet Prep HPF POC NONE SEEN NONE SEEN   Trich, Wet Prep NONE SEEN NONE SEEN   Clue Cells Wet Prep HPF POC NONE SEEN NONE SEEN   WBC, Wet Prep HPF POC FEW (A) NONE SEEN   Sperm NONE SEEN   CBC   Collection Time: 08/02/19  2:55 PM  Result Value Ref Range   WBC 9.4 4.0 - 10.5  K/uL   RBC 4.41 3.87 - 5.11 MIL/uL   Hemoglobin 13.0 12.0 - 15.0 g/dL   HCT 35.4 (L) 56.2 - 56.3 %   MCV 80.5 80.0 - 100.0 fL   MCH 29.5 26.0 - 34.0 pg   MCHC 36.6 (H) 30.0 - 36.0 g/dL   RDW 89.3 73.4 - 28.7 %   Platelets 244 150 - 400 K/uL   nRBC 0.0 0.0 - 0.2 %  Type and screen [facility]   Collection Time: 08/02/19  3:05 PM  Result Value Ref Range   ABO/RH(D) AB POS    Antibody Screen NEG    Sample Expiration      08/05/2019,2359 Performed at [facility], [address], [place], [place] [address]   ABO/Rh   Collection Time: 08/02/19  3:05 PM  Result Value Ref Range   ABO/RH(D)      AB POS Performed at [facility], [address], [place], [place] [address]   Urine rapid drug screen (hosp performed)   Collection Time: 08/02/19  5:16 PM  Result Value Ref Range   Opiates NONE DETECTED NONE DETECTED   Cocaine NONE DETECTED NONE DETECTED   Benzodiazepines NONE DETECTED NONE DETECTED   Amphetamines NONE DETECTED NONE DETECTED   Tetrahydrocannabinol POSITIVE (A) NONE DETECTED   Barbiturates NONE DETECTED NONE DETECTED  SARS CORONAVIRUS 2 (TAT 6-24 HRS) Nasopharyngeal Nasopharyngeal Swab   Collection Time: 08/02/19  5:17 PM   Specimen: Nasopharyngeal Swab  Result Value  Ref Range   SARS Coronavirus 2 NEGATIVE NEGATIVE    Discharge Condition: Stable  Disposition: Discharge disposition: 01-Home or Self Care        Discharge Instructions    Discharge activity:  No Restrictions   Complete by: As directed    Discharge diet:  No restrictions   Complete by: As directed    Do not have sex or do anything that might make you have an orgasm   Complete by: As directed    Fetal Kick Count:  Lie on our left side for one hour after a meal, and count the number of times your baby kicks.  If it is less than 5 times, get up, move around and drink some juice.  Repeat the test 30 minutes later.  If it is still less than 5 kicks in an hour, notify your  doctor.   Complete by: As directed    LABOR:  When conractions begin, you should start to time them from the beginning of one contraction to the beginning  of the next.  When contractions are 5 - 10 minutes apart or less and have been regular for at least an hour, you should call your health care provider.   Complete by: As directed    Notify physician for bleeding from the vagina   Complete by: As directed    Notify physician for blurring of vision or spots before the eyes   Complete by: As directed    Notify physician for chills or fever   Complete by: As directed    Notify physician for fainting spells, "black outs" or loss of consciousness   Complete by: As directed    Notify physician for increase in vaginal discharge   Complete by: As directed    Notify physician for leaking of fluid   Complete by: As directed    Notify physician for pain or burning when urinating   Complete by: As directed    Notify physician for pelvic pressure (sudden increase)   Complete by: As directed    Notify physician for severe or continued nausea or vomiting   Complete by: As directed    Notify physician for sudden gushing of fluid from the vagina (with or without continued leaking)   Complete by: As directed    Notify physician for sudden, constant, or occasional abdominal pain   Complete by: As directed    Notify physician if baby moving less than usual   Complete by: As directed      Allergies as of 08/06/2019   No Known Allergies     Medication List    STOP taking these medications   benzonatate 100 MG capsule Commonly known as: TESSALON   cephALEXin 500 MG capsule Commonly known as: Keflex   cetirizine-pseudoephedrine 5-120 MG tablet Commonly known as: ZYRTEC-D   diphenhydrAMINE 25 MG tablet Commonly known as: BENADRYL   famotidine 20 MG tablet Commonly known as: PEPCID   fluticasone 50 MCG/ACT nasal spray Commonly known as: FLONASE   metoCLOPramide 5 MG tablet Commonly  known as: Reglan   naproxen 375 MG tablet Commonly known as: NAPROSYN   phenol 1.4 % Liqd Commonly known as: CHLORASEPTIC     TAKE these medications   docusate sodium 100 MG capsule Commonly known as: COLACE Take 1 capsule (100 mg total) by mouth daily. Start taking on: August 07, 2019   Prenatal 27-1 MG Tabs Take 1 tablet by mouth daily.      Follow-up Information  Department, [facility]. Schedule an appointment as soon as possible for a visit in 1 week(s).   Contact information: [address] [place] [id number] [phone]           Signed: Hermina StaggersMichael L Amory Simonetti M.D. 08/06/2019, 11:28 AM

## 2019-08-06 NOTE — Progress Notes (Signed)
Discharge instructions given to patient. Discussed follow up appointment in 1 week.  Reviewed instructions including fetal kick count and medication changes. Patient verbalized understanding.

## 2019-08-07 LAB — GC/CHLAMYDIA PROBE AMP (~~LOC~~) NOT AT ARMC
Chlamydia: NEGATIVE
Comment: NEGATIVE
Comment: NORMAL
Neisseria Gonorrhea: NEGATIVE

## 2019-08-12 ENCOUNTER — Inpatient Hospital Stay (HOSPITAL_COMMUNITY)
Admission: AD | Admit: 2019-08-12 | Discharge: 2019-08-14 | DRG: 807 | Disposition: A | Discharge status: Home / Self Care | Payer: Medicaid Other | Attending: Family Medicine | Admitting: Family Medicine

## 2019-08-12 ENCOUNTER — Other Ambulatory Visit: Payer: Self-pay

## 2019-08-12 ENCOUNTER — Inpatient Hospital Stay (HOSPITAL_COMMUNITY): Payer: Medicaid Other | Admitting: Anesthesiology

## 2019-08-12 ENCOUNTER — Encounter (HOSPITAL_COMMUNITY): Payer: Self-pay

## 2019-08-12 DIAGNOSIS — Z20828 Contact with and (suspected) exposure to other viral communicable diseases: Secondary | ICD-10-CM | POA: Diagnosis present

## 2019-08-12 DIAGNOSIS — D573 Sickle-cell trait: Secondary | ICD-10-CM | POA: Diagnosis present

## 2019-08-12 DIAGNOSIS — O99824 Streptococcus B carrier state complicating childbirth: Secondary | ICD-10-CM

## 2019-08-12 DIAGNOSIS — Z3A36 36 weeks gestation of pregnancy: Secondary | ICD-10-CM

## 2019-08-12 LAB — OB RESULTS CONSOLE HEPATITIS B SURFACE ANTIGEN: Hepatitis B Surface Ag: NEGATIVE

## 2019-08-12 LAB — CBC
HCT: 35.8 % — ABNORMAL LOW (ref 36.0–46.0)
Hemoglobin: 12.6 g/dL (ref 12.0–15.0)
MCH: 28.3 pg (ref 26.0–34.0)
MCHC: 35.2 g/dL (ref 30.0–36.0)
MCV: 80.3 fL (ref 80.0–100.0)
Platelets: 278 10*3/uL (ref 150–400)
RBC: 4.46 MIL/uL (ref 3.87–5.11)
RDW: 13.2 % (ref 11.5–15.5)
WBC: 12.4 10*3/uL — ABNORMAL HIGH (ref 4.0–10.5)
nRBC: 0 % (ref 0.0–0.2)

## 2019-08-12 LAB — TYPE AND SCREEN
ABO/RH(D): AB POS
Antibody Screen: NEGATIVE

## 2019-08-12 LAB — OB RESULTS CONSOLE RUBELLA ANTIBODY, IGM: Rubella: IMMUNE

## 2019-08-12 LAB — OB RESULTS CONSOLE HIV ANTIBODY (ROUTINE TESTING): HIV: NONREACTIVE

## 2019-08-12 LAB — OB RESULTS CONSOLE RPR: RPR: NONREACTIVE

## 2019-08-12 LAB — SARS CORONAVIRUS 2 BY RT PCR (HOSPITAL ORDER, PERFORMED IN ~~LOC~~ HOSPITAL LAB): SARS Coronavirus 2: NEGATIVE

## 2019-08-12 MED ORDER — ZOLPIDEM TARTRATE 5 MG PO TABS: 5.0000 mg | ORAL_TABLET | Freq: Every evening | ORAL | Status: DC | PRN

## 2019-08-12 MED ORDER — SENNOSIDES-DOCUSATE SODIUM 8.6-50 MG PO TABS
2.0000 | ORAL_TABLET | ORAL | Status: DC
  Administered 2019-08-13 (×2): 2 via ORAL
  Filled 2019-08-12 (×2): qty 2

## 2019-08-12 MED ORDER — DIBUCAINE (PERIANAL) 1 % EX OINT: 1.0000 "application " | TOPICAL_OINTMENT | CUTANEOUS | Status: DC | PRN

## 2019-08-12 MED ORDER — OXYCODONE HCL 5 MG PO TABS
5.0000 mg | ORAL_TABLET | ORAL | Status: DC | PRN
  Administered 2019-08-13: 5 mg via ORAL
  Filled 2019-08-12: qty 1

## 2019-08-12 MED ORDER — LACTATED RINGERS IV SOLN: 500.0000 mL | Freq: Once | INTRAVENOUS | Status: DC

## 2019-08-12 MED ORDER — DIPHENHYDRAMINE HCL 25 MG PO CAPS: 25.0000 mg | ORAL_CAPSULE | Freq: Four times a day (QID) | ORAL | Status: DC | PRN

## 2019-08-12 MED ORDER — EPHEDRINE 5 MG/ML INJ: 10.0000 mg | INTRAVENOUS | Status: DC | PRN

## 2019-08-12 MED ORDER — FENTANYL CITRATE (PF) 100 MCG/2ML IJ SOLN
50.0000 ug | INTRAMUSCULAR | Status: DC | PRN
  Administered 2019-08-12: 100 ug via INTRAVENOUS
  Filled 2019-08-12: qty 2

## 2019-08-12 MED ORDER — FLEET ENEMA 7-19 GM/118ML RE ENEM: 1.0000 | ENEMA | RECTAL | Status: DC | PRN

## 2019-08-12 MED ORDER — LACTATED RINGERS IV SOLN
INTRAVENOUS | Status: DC
  Administered 2019-08-12: 04:00:00 via INTRAVENOUS

## 2019-08-12 MED ORDER — PHENYLEPHRINE 40 MCG/ML (10ML) SYRINGE FOR IV PUSH (FOR BLOOD PRESSURE SUPPORT): 80.0000 ug | PREFILLED_SYRINGE | INTRAVENOUS | Status: DC | PRN

## 2019-08-12 MED ORDER — SODIUM CHLORIDE (PF) 0.9 % IJ SOLN
INTRAMUSCULAR | Status: DC | PRN
  Administered 2019-08-12: 12 mL/h via EPIDURAL

## 2019-08-12 MED ORDER — FENTANYL-BUPIVACAINE-NACL 0.5-0.125-0.9 MG/250ML-% EP SOLN: 12.0000 mL/h | EPIDURAL | Status: DC | PRN

## 2019-08-12 MED ORDER — OXYTOCIN BOLUS FROM INFUSION
500.0000 mL | Freq: Once | INTRAVENOUS | Status: AC
  Administered 2019-08-12: 500 mL via INTRAVENOUS

## 2019-08-12 MED ORDER — COCONUT OIL OIL: 1.0000 "application " | TOPICAL_OIL | Status: DC | PRN

## 2019-08-12 MED ORDER — OXYTOCIN 40 UNITS IN NORMAL SALINE INFUSION - SIMPLE MED
2.5000 [IU]/h | INTRAVENOUS | Status: DC
  Filled 2019-08-12: qty 1000

## 2019-08-12 MED ORDER — PRENATAL MULTIVITAMIN CH
1.0000 | ORAL_TABLET | Freq: Every day | ORAL | Status: DC
  Administered 2019-08-13 – 2019-08-14 (×2): 1 via ORAL
  Filled 2019-08-12 (×2): qty 1

## 2019-08-12 MED ORDER — ACETAMINOPHEN 325 MG PO TABS: 650.0000 mg | ORAL_TABLET | ORAL | Status: DC | PRN

## 2019-08-12 MED ORDER — OXYCODONE HCL 5 MG PO TABS: 10.0000 mg | ORAL_TABLET | ORAL | Status: DC | PRN

## 2019-08-12 MED ORDER — OXYCODONE-ACETAMINOPHEN 5-325 MG PO TABS: 1.0000 | ORAL_TABLET | ORAL | Status: DC | PRN

## 2019-08-12 MED ORDER — TETANUS-DIPHTH-ACELL PERTUSSIS 5-2.5-18.5 LF-MCG/0.5 IM SUSP: 0.5000 mL | Freq: Once | INTRAMUSCULAR | Status: DC

## 2019-08-12 MED ORDER — HYDROXYZINE HCL 50 MG PO TABS: 50.0000 mg | ORAL_TABLET | Freq: Four times a day (QID) | ORAL | Status: DC | PRN

## 2019-08-12 MED ORDER — SODIUM CHLORIDE 0.9 % IV SOLN
2.0000 g | Freq: Once | INTRAVENOUS | Status: AC
  Administered 2019-08-12: 2 g via INTRAVENOUS
  Filled 2019-08-12: qty 2000

## 2019-08-12 MED ORDER — OXYCODONE-ACETAMINOPHEN 5-325 MG PO TABS: 2.0000 | ORAL_TABLET | ORAL | Status: DC | PRN

## 2019-08-12 MED ORDER — ONDANSETRON HCL 4 MG/2ML IJ SOLN: 4.0000 mg | INTRAMUSCULAR | Status: DC | PRN

## 2019-08-12 MED ORDER — DIPHENHYDRAMINE HCL 50 MG/ML IJ SOLN: 12.5000 mg | INTRAMUSCULAR | Status: DC | PRN

## 2019-08-12 MED ORDER — LIDOCAINE HCL (PF) 1 % IJ SOLN
INTRAMUSCULAR | Status: DC | PRN
  Administered 2019-08-12 (×2): 4 mL via EPIDURAL

## 2019-08-12 MED ORDER — SOD CITRATE-CITRIC ACID 500-334 MG/5ML PO SOLN: 30.0000 mL | ORAL | Status: DC | PRN

## 2019-08-12 MED ORDER — LACTATED RINGERS IV SOLN: 500.0000 mL | INTRAVENOUS | Status: DC | PRN

## 2019-08-12 MED ORDER — ONDANSETRON HCL 4 MG PO TABS: 4.0000 mg | ORAL_TABLET | ORAL | Status: DC | PRN

## 2019-08-12 MED ORDER — BENZOCAINE-MENTHOL 20-0.5 % EX AERO
1.0000 "application " | INHALATION_SPRAY | CUTANEOUS | Status: DC | PRN
  Administered 2019-08-12: 1 via TOPICAL
  Filled 2019-08-12: qty 56

## 2019-08-12 MED ORDER — ONDANSETRON HCL 4 MG/2ML IJ SOLN: 4.0000 mg | Freq: Four times a day (QID) | INTRAMUSCULAR | Status: DC | PRN

## 2019-08-12 MED ORDER — SIMETHICONE 80 MG PO CHEW: 80.0000 mg | CHEWABLE_TABLET | ORAL | Status: DC | PRN

## 2019-08-12 MED ORDER — PENICILLIN G 3 MILLION UNITS IVPB - SIMPLE MED
3.0000 10*6.[IU] | INTRAVENOUS | Status: DC
  Administered 2019-08-12: 3 10*6.[IU] via INTRAVENOUS
  Filled 2019-08-12 (×3): qty 100

## 2019-08-12 MED ORDER — IBUPROFEN 600 MG PO TABS
600.0000 mg | ORAL_TABLET | Freq: Four times a day (QID) | ORAL | Status: DC
  Administered 2019-08-12 – 2019-08-14 (×8): 600 mg via ORAL
  Filled 2019-08-12 (×8): qty 1

## 2019-08-12 MED ORDER — WITCH HAZEL-GLYCERIN EX PADS: 1.0000 "application " | MEDICATED_PAD | CUTANEOUS | Status: DC | PRN

## 2019-08-12 MED ORDER — FENTANYL-BUPIVACAINE-NACL 0.5-0.125-0.9 MG/250ML-% EP SOLN
EPIDURAL | Status: AC
  Filled 2019-08-12: qty 250

## 2019-08-12 MED ORDER — LIDOCAINE HCL (PF) 1 % IJ SOLN: 30.0000 mL | INTRAMUSCULAR | Status: DC | PRN

## 2019-08-12 NOTE — Anesthesia Preprocedure Evaluation (Signed)
Anesthesia Evaluation  Patient identified by MRN, date of birth, ID band Patient awake    Reviewed: Allergy & Precautions, Patient's Chart, lab work & pertinent test results  History of Anesthesia Complications Negative for: history of anesthetic complications  Airway Mallampati: II  TM Distance: >3 FB Neck ROM: Full    Dental no notable dental hx.    Pulmonary neg pulmonary ROS,    Pulmonary exam normal        Cardiovascular negative cardio ROS Normal cardiovascular exam     Neuro/Psych negative neurological ROS  negative psych ROS   GI/Hepatic negative GI ROS, Neg liver ROS,   Endo/Other  negative endocrine ROS  Renal/GU negative Renal ROS     Musculoskeletal negative musculoskeletal ROS (+)   Abdominal   Peds  Hematology negative hematology ROS (+)   Anesthesia Other Findings   Reproductive/Obstetrics (+) Pregnancy                             Anesthesia Physical Anesthesia Plan  ASA: II  Anesthesia Plan: Epidural   Post-op Pain Management:    Induction:   PONV Risk Score and Plan: Treatment may vary due to age or medical condition  Airway Management Planned: Natural Airway  Additional Equipment:   Intra-op Plan:   Post-operative Plan:   Informed Consent: I have reviewed the patients History and Physical, chart, labs and discussed the procedure including the risks, benefits and alternatives for the proposed anesthesia with the patient or authorized representative who has indicated his/her understanding and acceptance.       Plan Discussed with:   Anesthesia Plan Comments:         Anesthesia Quick Evaluation  

## 2019-08-12 NOTE — H&P (Addendum)
Kelly Lyons is a 23 y.o. female G1P0 with IUP at [redacted]w[redacted]d presenting for contractions. Pt states she has been having irregular, every 3-4 minutes contractions, associated with scant staining vaginal bleeding for several hours..  Membranes ruptured spontaneously upon arrival to L&D, clear fluid, with active fetal movement.   PNCare at HD  Prenatal History/Complications: Admitted for 3rd trimester post coital bleeding a few weeks ago  Past Medical History: Past Medical History:  Diagnosis Date  . Eczema    on face   [place] trait  Past Surgical History: Past Surgical History:  Procedure Laterality Date  . SHOULDER ARTHROSCOPY  December   For severely dislocated shoulder    Obstetrical History: OB History    Gravida  1   Para      Term      Preterm      AB      Living        SAB      TAB      Ectopic      Multiple      Live Births               Social History: Social History   Socioeconomic History  . Marital status: Single    Spouse name: Not on file  . Number of children: Not on file  . Years of education: Not on file  . Highest education level: Not on file  Occupational History  . Not on file  Social Needs  . Financial resource strain: Not on file  . Food insecurity    Worry: Not on file    Inability: Not on file  . Transportation needs    Medical: Not on file    Non-medical: Not on file  Tobacco Use  . Smoking status: Never Smoker  . Smokeless tobacco: Never Used  Substance and Sexual Activity  . Alcohol use: No  . Drug use: No  . Sexual activity: Yes  Lifestyle  . Physical activity    Days per week: Not on file    Minutes per session: Not on file  . Stress: Not on file  Relationships  . Social Herbalist on phone: Not on file    Gets together: Not on file    Attends religious service: Not on file    Active member of club or organization: Not on file    Attends meetings of clubs or organizations: Not on file   Relationship status: Not on file  Other Topics Concern  . Not on file  Social History Narrative  . Not on file    Family History: History reviewed. No pertinent family history.  Allergies: No Known Allergies  Medications Prior to Admission  Medication Sig Dispense Refill Last Dose  . docusate sodium (COLACE) 100 MG capsule Take 1 capsule (100 mg total) by mouth daily. 30 capsule 2   . Prenatal 27-1 MG TABS Take 1 tablet by mouth daily.           Review of Systems   Constitutional: Negative for fever and chills Eyes: Negative for visual disturbances Respiratory: Negative for shortness of breath, dyspnea Cardiovascular: Negative for chest pain or palpitations  Gastrointestinal: Negative for vomiting, diarrhea and constipation.  POSITIVE for abdominal pain (contractions) Genitourinary: Negative for dysuria and urgency Musculoskeletal: Negative for back pain, joint pain, myalgias  Neurological: Negative for dizziness and headaches      Blood pressure (!) 104/58, pulse 62, temperature 98 F (36.7 C), temperature source  Oral, resp. rate 18, height 5' (1.524 m), weight 73 kg, last menstrual period 11/27/2018. General appearance: alert, cooperative and mild distress Lungs: normal respiratory effort Heart: regular rate and rhythm Abdomen: soft, non-tender; bowel sounds normal Extremities: Homans sign is negative, no sign of DVT DTR's 2+ Presentation: cephalic Fetal monitoring  Baseline: 120 bpm, Variability: Good {> 6 bpm), Accelerations: Reactive and Decelerations: Absent Uterine activity  2-3 Dilation: 6 Effacement (%): 90 Station: 0 Exam by:: B McClam, RN    Prenatal labs: ABO, Rh: --/--/AB POS (10/20 0410) Antibody: NEG (10/20 0410) Rubella:  immune RPR:   neg HBsAg:   neg HIV:   neg GBS:   unknown cx pending 1 hr Glucola 84 Genetic screening  neg Anatomy US normawl  Prenatal Transfer Tool  Maternal Diabetes: No Genetic Screening: Normal Maternal  Ultrasounds/Referrals: Normal Fetal Ultrasounds or other Referrals:  None Maternal Substance Abuse:  Yes:  Type: Marijuana Significant Maternal Medications:  None Significant Maternal Lab Results: Other: GBS unknown    Results for orders placed or performed during the hospital encounter of 08/12/19 (from the past 24 hour(s))  CBC   Collection Time: 08/12/19  4:10 AM  Result Value Ref Range   WBC 12.4 (H) 4.0 - 10.5 K/uL   RBC 4.46 3.87 - 5.11 MIL/uL   Hemoglobin 12.6 12.0 - 15.0 g/dL   HCT 03.5 (L) 00.9 - 38.1 %   MCV 80.3 80.0 - 100.0 fL   MCH 28.3 26.0 - 34.0 pg   MCHC 35.2 30.0 - 36.0 g/dL   RDW 82.9 93.7 - 16.9 %   Platelets 278 150 - 400 K/uL   nRBC 0.0 0.0 - 0.2 %  Type and screen [facility]   Collection Time: 08/12/19  4:10 AM  Result Value Ref Range   ABO/RH(D) AB POS    Antibody Screen NEG    Sample Expiration      08/15/2019,2359 Performed at [facility], [address], [place], [place] [address]   SARS Coronavirus 2 by RT PCR (hospital order, performed in [facility] hospital lab) Nasopharyngeal Nasopharyngeal Swab   Collection Time: 08/12/19  4:20 AM   Specimen: Nasopharyngeal Swab  Result Value Ref Range   SARS Coronavirus 2 NEGATIVE NEGATIVE    Assessment: Kelly Lyons is a 23 y.o. G1P0 with an IUP at [redacted]w[redacted]d presenting for labor  Plan: #Labor: expectant management #Pain:  Per request #FWB Cat 1 #ID: GBS: AMP (thought to be 8cms, changed to PCN when cx was 5cms)  #MOF:  breast #MOC: unknown #Circ: no (girl)   Jacklyn Shell 08/12/2019, 6:48 AM

## 2019-08-12 NOTE — Lactation Note (Signed)
Lactation Consultation Note  Patient Name: Kelly Lyons NTIRW'E Date: 08/12/2019 Reason for consult: Initial assessment;Primapara;1st time breastfeeding;Infant < 6lbs;Late-preterm 34-36.6wks  1831 - [place].Purple - I conducted an initial consult with Kelly Lyons, a P1 who desires to provide only her breast milk. She would like to breast feed and pump during lactogenesis I and then pump and bottle feed when her mature milk transitions.  She reports that baby Kelly Lyons has latched since delivery and last fed around 4 pm.  When we arrived, Kelly Lyons was awaiting her dinner, and her dinner arrived during this consult. I assisted with reviewing how to use a DEBP, and I observed her pump. I showed her how to clean her pump equipment.  Baby was not curing while in the room, but I indicated that Kelly Lyons should eat at least 8-12 times a day. I recommended that Kelly Lyons wake baby to feed and that baby should eat within the hour.  Kelly Lyons support person arrived towards the end of the consult. I reviewed the LPTI guidelines for pumping and supplementation and explained the rationale. I volunteered to return to help with latching this evening upon request.   We reviewed community breast feeding support resources.  PLAN: Breast feed 8-12 times a day on demand, waking to feed as needed. Monitor for hunger cues (reviewed). Pump both breasts every three hours. Supplement 5 mls post feedings (provide her breast milk first if she is able to pump any). Call lactation for assistance this evening for latch help.   Maternal Data Does the patient have breastfeeding experience prior to this delivery?: No  Interventions Interventions: Breast feeding basics reviewed;DEBP  Lactation Tools Discussed/Used Tools: Pump Breast pump type: Double-Electric Breast Pump Pump Review: Setup, frequency, and cleaning;Milk Storage   Consult Status Consult Status: Follow-up Date: 08/13/19 Follow-up type:  In-patient    Kelly Lyons 08/12/2019, 7:01 PM

## 2019-08-12 NOTE — Anesthesia Postprocedure Evaluation (Signed)
Anesthesia Post Note  Patient: Patrizia Paule  Procedure(s) Performed: AN AD Vass     Patient location during evaluation: Mother Baby Anesthesia Type: Epidural Level of consciousness: awake, awake and alert and oriented Pain management: pain level controlled Vital Signs Assessment: post-procedure vital signs reviewed and stable Respiratory status: spontaneous breathing Cardiovascular status: blood pressure returned to baseline Postop Assessment: no headache, no backache, epidural receding, patient able to bend at knees, able to ambulate, adequate PO intake and no apparent nausea or vomiting Anesthetic complications: no    Last Vitals:  Vitals:   08/12/19 1310 08/12/19 1740  BP: 128/83 118/70  Pulse: 60 66  Resp: 18 18  Temp: 37.2 C 37 C  SpO2: 100% 100%    Last Pain:  Vitals:   08/12/19 1820  TempSrc:   PainSc: 0-No pain   Pain Goal:                   Kathie Rhodes

## 2019-08-12 NOTE — MAU Note (Signed)
Patient presents in active labor with complaints of contractions 3 minutes apart.  States she has been leaking clear fluid since 2200.  Endorses  + FM, no complications w/ the pregnancy.  PNC at HD.

## 2019-08-12 NOTE — Discharge Summary (Addendum)
Postpartum Discharge Summary  Patient Name: Kelly Lyons DOB: 1996/02/06 MRN: [id number]  Date of admission: 08/12/2019 Delivering Provider: Ulla Gallo B   Date of discharge: 08/16/2019  Admitting diagnosis: 39wks ctx Intrauterine pregnancy: [redacted]w[redacted]d    Secondary diagnosis:  Active Problems:   Indication for care in labor or delivery   Sickle cell trait ([facility]  Additional problems: N/A     Discharge diagnosis: Preterm Pregnancy Delivered                                                                                                Post partum procedures:N/A  Augmentation: None  Complications: None  Hospital course:  Onset of Labor With Preterm Vaginal Delivery     23 y.o. yo G1P0 at 369w6das admitted in Active Labor on 08/12/2019. Patient had an uncomplicated labor course, including a dose of Amp and PCN for unknown GBS status.  Membrane Rupture Time/Date: 10:00 PM ,08/11/2019   Intrapartum Procedures: Episiotomy: None [1]                                         Lacerations:  None [1]  Patient had a delivery of a Viable infant. 08/12/2019  Information for the patient's newborn:  [place], [place] [[id number]]Delivery Method: Vaginal, Spontaneous(Filed from Delivery Summary)     Pateint had an uncomplicated postpartum course.  She is ambulating, tolerating a regular diet, passing flatus, and urinating well. Patient is discharged home in stable condition on 08/14/19.  Delivery time: 10:17 AM    Magnesium Sulfate received: No BMZ received: No Rhophylac:N/A MMR:N/A Transfusion:No  Physical exam  Vitals:   08/13/19 0511 08/13/19 1506 08/13/19 2156 08/14/19 0540  BP: (!) 141/62 (!) 112/56 108/72 (!) 100/58  Pulse: (!) 53 68 76 (!) 59  Resp: _0 Temp: 98.4 F (36.9 C) 98.9 F (37.2 C) 98 F (36.7 C) 98.3 F (36.8 C)  TempSrc: Oral  Oral Oral  SpO2: 98%  99% 99%  Weight:      Height:       General: alert, cooperative and no  distress Lochia: appropriate Uterine Fundus: firm Incision: N/A DVT Evaluation: No evidence of DVT seen on physical exam. Labs: Lab Results  Component Value Date   WBC 12.4 (H) 08/12/2019   HGB 12.6 08/12/2019   HCT 35.8 (L) 08/12/2019   MCV 80.3 08/12/2019   PLT 278 08/12/2019   CMP Latest Ref Rng & Units 06/26/2019  Glucose 70 - 99 mg/dL 69(L)  BUN 6 - 20 mg/dL <5(L)  Creatinine 0.44 - 1.00 mg/dL 0.73  Sodium 135 - 145 mmol/L 135  Potassium 3.5 - 5.1 mmol/L 3.5  Chloride 98 - 111 mmol/L 103  CO2 22 - 32 mmol/L 18(L)  Calcium 8.9 - 10.3 mg/dL 9.4  Total Protein 6.5 - 8.1 g/dL 7.5  Total Bilirubin 0.3 - 1.2 mg/dL 0.5  Alkaline Phos 38 - 126 U/L 163(H)  AST 15 - 41 U/L 20  ALT 0 -  44 U/L 11    Discharge instruction: per After Visit Summary and "Baby and Me Booklet".  After visit meds:  Allergies as of 08/14/2019   No Known Allergies     Medication List    STOP taking these medications   docusate sodium 100 MG capsule Commonly known as: COLACE     TAKE these medications   acetaminophen 325 MG tablet Commonly known as: Tylenol Take 2 tablets (650 mg total) by mouth every 4 (four) hours as needed (for pain scale < 4).   ibuprofen 600 MG tablet Commonly known as: ADVIL Take 1 tablet (600 mg total) by mouth every 6 (six) hours.   Prenatal 27-1 MG Tabs Take 1 tablet by mouth daily.       Diet: No evidence of DVT seen on physical exam.  Activity: Advance as tolerated. Pelvic rest for 6 weeks.   Outpatient follow up:4 weeks Follow up Appt:No future appointments. Follow up Visit: Follow-up Information    Department, [facility]. Schedule an appointment as soon as possible for a visit in 4 week(s).   Why: Please schedule a post delivery appointment 4 to 6 weeks after discharge Contact information: [address] [place] [address] [phone]            Please schedule this patient for Postpartum visit in: 4 weeks with the  following provider: Any provider For C/S patients schedule nurse incision check in weeks 2 weeks: no Low risk pregnancy complicated by: None Delivery mode:  SVD Anticipated Birth Control:  other/unsure PP Procedures needed: None  Schedule Integrated BH visit: no  Newborn Data: Live born female  Birth Weight:  2685 APGAR: 8, 9  Newborn Delivery   Birth date/time: 08/12/2019 10:17:00 Delivery type:       Baby Feeding: Breast Disposition:home with mother  Gifford Shave, MD  PGY-1, [facility]  08/16/19 1:13 AM  CNM attestation I have seen and examined this patient and agree with above documentation in the resident's note.   Kelly Lyons is a 23 y.o. G1P0101 s/p preterm SVD.   Pain is well controlled.  Plan for birth control is IUD.  Method of Feeding: breast  PE:  BP (!) 100/58 (BP Location: Right Arm)   Pulse (!) 59   Temp 98.3 F (36.8 C) (Oral)   Resp 18   Ht 5' (1.524 m)   Wt 73 kg   LMP 11/27/2018   SpO2 99%   Breastfeeding Unknown   BMI 31.44 kg/m  Fundus firm  No results for input(s): HGB, HCT in the last 72 hours.   Plan: discharge  - postpartum care discussed - f/u clinic in 4-6 weeks for postpartum visit   Myrtis Ser, CNM 1:13 AM

## 2019-08-12 NOTE — Anesthesia Procedure Notes (Signed)
Epidural Patient location during procedure: OB Start time: 08/12/2019 5:34 AM End time: 08/12/2019 5:37 AM  Staffing Anesthesiologist: Brennan Bailey, MD Performed: anesthesiologist   Preanesthetic Checklist Completed: patient identified, pre-op evaluation, timeout performed, IV checked, risks and benefits discussed and monitors and equipment checked  Epidural Patient position: sitting Prep: site prepped and draped and DuraPrep Patient monitoring: continuous pulse ox, blood pressure and heart rate Approach: midline Location: L3-L4 Injection technique: LOR air  Needle:  Needle type: Tuohy  Needle gauge: 17 G Needle length: 9 cm Needle insertion depth: 8 cm Catheter type: closed end flexible Catheter size: 19 Gauge Catheter at skin depth: 13 cm Test dose: negative and Other (1% lidocaine)  Assessment Events: blood not aspirated, injection not painful, no injection resistance, negative IV test and no paresthesia  Additional Notes Patient identified. Risks, benefits, and alternatives discussed with patient including but not limited to bleeding, infection, nerve damage, paralysis, failed block, incomplete pain control, headache, blood pressure changes, nausea, vomiting, reactions to medication, itching, and postpartum back pain. Confirmed with bedside nurse the patient's most recent platelet count. Confirmed with patient that they are not currently taking any anticoagulation, have any bleeding history, or any family history of bleeding disorders. Patient expressed understanding and wished to proceed. All questions were answered. Sterile technique was used throughout the entire procedure. Please see nursing notes for vital signs.   Crisp LOR on first pass. Test dose was given through epidural catheter and negative prior to continuing to dose epidural or start infusion. Warning signs of high block given to the patient including shortness of breath, tingling/numbness in hands, complete  motor block, or any concerning symptoms with instructions to call for help. Patient was given instructions on fall risk and not to get out of bed. All questions and concerns addressed with instructions to call with any issues or inadequate analgesia.  Reason for block:procedure for pain

## 2019-08-13 LAB — RPR: RPR Ser Ql: NONREACTIVE
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
## 2019-08-13 NOTE — Lactation Note (Addendum)
This note was copied from a baby's chart. Lactation Consultation Note  Patient Name: Girl Jannae Fagerstrom [id number] Date: 08/13/2019 Reason for consult: Follow-up assessment;Late-preterm 34-36.6wks;1st time breastfeeding;Primapara  LC in to visit with P1 Mom of 36.6 week baby at 73 hrs old.  Baby at 4.5% weight loss.    Baby sleeping STS on Mom's chest.  Mom states baby is latching and breastfeeding well.  Mom wakes baby if baby is still sleeping after 3 hrs.  Mom reports baby fed often during the night.  Explained to Mom about normal LPTI feeding behavior.  Reassured Mom about normal sleepiness after the first 24 hrs. Encouraged double pumping/hand expressing after baby breastfeeds.  Mom states she has pumped a few times.   Disassembled pump parts, washed, rinsed, and set out to air dry in separate basin.  Encouraged Mom to do this after each pumping to provide time for parts to dry.  Plan- 1- Keep baby STS as much as possible, breastfeeding with cues 2- Awaken baby well if sleeping after 3-4 hrs. 3- Pump both breasts on initiation setting 4- Feed baby any EBM by spoon.   5- If baby is too sleepy to latch and breastfeed, and unable to express enough colostrum per volume guidelines (10-20 ml today), 22 cal formula to be used as a temporary bridge to exclusive breastfeeding. 6- ask for help prn.  Mom has [facility] in [place].  Faxed request for [facility] at discharge.  Mom aware.  Consult Status Consult Status: Follow-up Date: 08/14/19 Follow-up type: In-patient    Broadus John 08/13/2019, 11:52 AM

## 2019-08-14 MED ORDER — IBUPROFEN 600 MG PO TABS: 600.0000 mg | ORAL_TABLET | Freq: Four times a day (QID) | ORAL | 0 refills | Status: DC

## 2019-08-14 MED ORDER — ACETAMINOPHEN 325 MG PO TABS: 650.0000 mg | ORAL_TABLET | ORAL | Status: DC | PRN

## 2019-08-14 NOTE — Lactation Note (Signed)
This note was copied from a baby's chart. Lactation Consultation Note  Patient Name: Kelly Lyons [id number] Date: 08/14/2019 Reason for consult: Follow-up assessment;Infant < 6lbs;Late-preterm 34-36.6wks Baby is 72 hours old.7% weight loss.  Mom states baby is feeding very well and cluster fed last night.  She is doing some post pumping.  Discussed milk coming to volume and the prevention and treatment of engorgement.  Manual pump given with instructions.  Mom denies questions or concerns.  Reviewed outpatient services and encouraged to call prn.  Maternal Data    Feeding Feeding Type: Breast Fed  LATCH Score Latch: Grasps breast easily, tongue down, lips flanged, rhythmical sucking.  Audible Swallowing: Spontaneous and intermittent  Type of Nipple: Everted at rest and after stimulation  Comfort (Breast/Nipple): Soft / non-tender  Hold (Positioning): No assistance needed to correctly position infant at breast.  LATCH Score: 10  Interventions Interventions: Hand pump  Lactation Tools Discussed/Used     Consult Status Consult Status: Complete Follow-up type: Call as needed    [name] 08/14/2019, 9:27 AM

## 2019-08-14 NOTE — Discharge Instructions (Signed)
Postpartum Care After Vaginal Delivery °This sheet gives you information about how to care for yourself from the time you deliver your baby to up to 6-12 weeks after delivery (postpartum period). Your health care provider may also give you more specific instructions. If you have problems or questions, contact your health care provider. °Follow these instructions at home: °Vaginal bleeding °· It is normal to have vaginal bleeding (lochia) after delivery. Wear a sanitary pad for vaginal bleeding and discharge. °? During the first week after delivery, the amount and appearance of lochia is often similar to a menstrual period. °? Over the next few weeks, it will gradually decrease to a dry, yellow-brown discharge. °? For most women, lochia stops completely by 4-6 weeks after delivery. Vaginal bleeding can vary from woman to woman. °· Change your sanitary pads frequently. Watch for any changes in your flow, such as: °? A sudden increase in volume. °? A change in color. °? Large blood clots. °· If you pass a blood clot from your vagina, save it and call your health care provider to discuss. Do not flush blood clots down the toilet before talking with your health care provider. °· Do not use tampons or douches until your health care provider says this is safe. °· If you are not breastfeeding, your period should return 6-8 weeks after delivery. If you are feeding your child breast milk only (exclusive breastfeeding), your period may not return until you stop breastfeeding. °Perineal care °· Keep the area between the vagina and the anus (perineum) clean and dry as told by your health care provider. Use medicated pads and pain-relieving sprays and creams as directed. °· If you had a cut in the perineum (episiotomy) or a tear in the vagina, check the area for signs of infection until you are healed. Check for: °? More redness, swelling, or pain. °? Fluid or blood coming from the cut or tear. °? Warmth. °? Pus or a bad  smell. °· You may be given a squirt bottle to use instead of wiping to clean the perineum area after you go to the bathroom. As you start healing, you may use the squirt bottle before wiping yourself. Make sure to wipe gently. °· To relieve pain caused by an episiotomy, a tear in the vagina, or swollen veins in the anus (hemorrhoids), try taking a warm sitz bath 2-3 times a day. A sitz bath is a warm water bath that is taken while you are sitting down. The water should only come up to your hips and should cover your buttocks. °Breast care °· Within the first few days after delivery, your breasts may feel heavy, full, and uncomfortable (breast engorgement). Milk may also leak from your breasts. Your health care provider can suggest ways to help relieve the discomfort. Breast engorgement should go away within a few days. °· If you are breastfeeding: °? Wear a bra that supports your breasts and fits you well. °? Keep your nipples clean and dry. Apply creams and ointments as told by your health care provider. °? You may need to use breast pads to absorb milk that leaks from your breasts. °? You may have uterine contractions every time you breastfeed for up to several weeks after delivery. Uterine contractions help your uterus return to its normal size. °? If you have any problems with breastfeeding, work with your health care provider or lactation consultant. °· If you are not breastfeeding: °? Avoid touching your breasts a lot. Doing this can make   your breasts produce more milk. °? Wear a good-fitting bra and use cold packs to help with swelling. °? Do not squeeze out (express) milk. This causes you to make more milk. °Intimacy and sexuality °· Ask your health care provider when you can engage in sexual activity. This may depend on: °? Your risk of infection. °? How fast you are healing. °? Your comfort and desire to engage in sexual activity. °· You are able to get pregnant after delivery, even if you have not had  your period. If desired, talk with your health care provider about methods of birth control (contraception). °Medicines °· Take over-the-counter and prescription medicines only as told by your health care provider. °· If you were prescribed an antibiotic medicine, take it as told by your health care provider. Do not stop taking the antibiotic even if you start to feel better. °Activity °· Gradually return to your normal activities as told by your health care provider. Ask your health care provider what activities are safe for you. °· Rest as much as possible. Try to rest or take a nap while your baby is sleeping. °Eating and drinking ° °· Drink enough fluid to keep your urine pale yellow. °· Eat high-fiber foods every day. These may help prevent or relieve constipation. High-fiber foods include: °? Whole grain cereals and breads. °? Brown rice. °? Beans. °? Fresh fruits and vegetables. °· Do not try to lose weight quickly by cutting back on calories. °· Take your prenatal vitamins until your postpartum checkup or until your health care provider tells you it is okay to stop. °Lifestyle °· Do not use any products that contain nicotine or tobacco, such as cigarettes and e-cigarettes. If you need help quitting, ask your health care provider. °· Do not drink alcohol, especially if you are breastfeeding. °General instructions °· Keep all follow-up visits for you and your baby as told by your health care provider. Most women visit their health care provider for a postpartum checkup within the first 3-6 weeks after delivery. °Contact a health care provider if: °· You feel unable to cope with the changes that your child brings to your life, and these feelings do not go away. °· You feel unusually sad or worried. °· Your breasts become red, painful, or hard. °· You have a fever. °· You have trouble holding urine or keeping urine from leaking. °· You have little or no interest in activities you used to enjoy. °· You have not  breastfed at all and you have not had a menstrual period for 12 weeks after delivery. °· You have stopped breastfeeding and you have not had a menstrual period for 12 weeks after you stopped breastfeeding. °· You have questions about caring for yourself or your baby. °· You pass a blood clot from your vagina. °Get help right away if: °· You have chest pain. °· You have difficulty breathing. °· You have sudden, severe leg pain. °· You have severe pain or cramping in your lower abdomen. °· You bleed from your vagina so much that you fill more than one sanitary pad in one hour. Bleeding should not be heavier than your heaviest period. °· You develop a severe headache. °· You faint. °· You have blurred vision or spots in your vision. °· You have bad-smelling vaginal discharge. °· You have thoughts about hurting yourself or your baby. °If you ever feel like you may hurt yourself or others, or have thoughts about taking your own life, get help   right away. You can go to the nearest emergency department or call: °· Your local emergency services (911 in the [place] °· A suicide crisis helpline, such as the National Suicide Prevention Lifeline at 1-800-273-8255. This is open 24 hours a day. °Summary °· The period of time right after you deliver your newborn up to 6-12 weeks after delivery is called the postpartum period. °· Gradually return to your normal activities as told by your health care provider. °· Keep all follow-up visits for you and your baby as told by your health care provider. °This information is not intended to replace advice given to you by your health care provider. Make sure you discuss any questions you have with your health care provider. °Document Released: 08/06/2007 Document Revised: 10/12/2017 Document Reviewed: 07/23/2017 °Elsevier Patient Education © 2020 [employer] ° ° °Postpartum Baby Blues °The postpartum period begins right after the birth of a baby. During this time, there is often a lot of  joy and excitement. It is also a time of many changes in the life of the parents. No matter how many times a mother gives birth, each child brings new challenges to the family, including different ways of relating to one another. °It is common to have feelings of excitement along with confusing changes in moods, emotions, and thoughts. You may feel happy one minute and sad or stressed the next. These feelings of sadness usually happen in the period right after you have your baby, and they go away within a week or two. This is called the "baby blues." °What are the causes? °There is no known cause of baby blues. It is likely caused by a combination of factors. However, changes in hormone levels after childbirth are believed to trigger some of the symptoms. °Other factors that can play a role in these mood changes include: °· Lack of sleep. °· Stressful life events, such as poverty, caring for a loved one, or death of a loved one. °· Genetics. °What are the signs or symptoms? °Symptoms of this condition include: °· Brief changes in mood, such as going from extreme happiness to sadness. °· Decreased concentration. °· Difficulty sleeping. °· Crying spells and tearfulness. °· Loss of appetite. °· Irritability. °· Anxiety. °If the symptoms of baby blues last for more than 2 weeks or become more severe, you may have postpartum depression. °How is this diagnosed? °This condition is diagnosed based on an evaluation of your symptoms. There are no medical or lab tests that lead to a diagnosis, but there are various questionnaires that a health care provider may use to identify women with the baby blues or postpartum depression. °How is this treated? °Treatment is not needed for this condition. The baby blues usually go away on their own in 1-2 weeks. Social support is often all that is needed. You will be encouraged to get adequate sleep and rest. °Follow these instructions at home: °Lifestyle ° °  ° °· Get as much rest as you  can. Take a nap when the baby sleeps. °· Exercise regularly as told by your health care provider. Some women find yoga and walking to be helpful. °· Eat a balanced and nourishing diet. This includes plenty of fruits and vegetables, whole grains, and lean proteins. °· Do little things that you enjoy. Have a cup of tea, take a bubble bath, read your favorite magazine, or listen to your favorite music. °· Avoid alcohol. °· Ask for help with household chores, cooking, grocery shopping, or running errands.   Do not try to do everything yourself. Consider hiring a postpartum doula to help. This is a professional who specializes in providing support to new mothers. °· Try not to make any major life changes during pregnancy or right after giving birth. This can add stress. °General instructions °· Talk to people close to you about how you are feeling. Get support from your partner, family members, friends, or other new moms. You may want to join a support group. °· Find ways to cope with stress. This may include: °? Writing your thoughts and feelings in a journal. °? Spending time outside. °? Spending time with people who make you laugh. °· Try to stay positive in how you think. Think about the things you are grateful for. °· Take over-the-counter and prescription medicines only as told by your health care provider. °· Let your health care provider know if you have any concerns. °· Keep all postpartum visits as told by your health care provider. This is important. °Contact a health care provider if: °· Your baby blues do not go away after 2 weeks. °Get help right away if: °· You have thoughts of taking your own life (suicidal thoughts). °· You think you may harm the baby or other people. °· You see or hear things that are not there (hallucinations). °Summary °· After giving birth, you may feel happy one minute and sad or stressed the next. Feelings of sadness that happen right after the baby is born and go away after a week  or two are called the "baby blues." °· You can manage the baby blues by getting enough rest, eating a healthy diet, exercising, spending time with supportive people, and finding ways to cope with stress. °· If feelings of sadness and stress last longer than 2 weeks or get in the way of caring for your baby, talk to your health care provider. This may mean you have postpartum depression. °This information is not intended to replace advice given to you by your health care provider. Make sure you discuss any questions you have with your health care provider. °Document Released: 07/13/2004 Document Revised: 01/31/2019 Document Reviewed: 12/05/2016 °Elsevier Patient Education © 2020 Elsevier Inc. ° ° °

## 2019-08-16 DIAGNOSIS — D573 Sickle-cell trait: Secondary | ICD-10-CM | POA: Diagnosis present

## 2021-06-20 ENCOUNTER — Other Ambulatory Visit: Payer: Self-pay

## 2021-06-20 ENCOUNTER — Encounter (HOSPITAL_BASED_OUTPATIENT_CLINIC_OR_DEPARTMENT_OTHER): Payer: Self-pay | Admitting: Obstetrics and Gynecology

## 2021-06-20 ENCOUNTER — Emergency Department (HOSPITAL_BASED_OUTPATIENT_CLINIC_OR_DEPARTMENT_OTHER)
Admission: EM | Admit: 2021-06-20 | Discharge: 2021-06-20 | Disposition: A | Discharge status: Home / Self Care | Payer: Medicaid Other | Attending: Emergency Medicine | Admitting: Emergency Medicine

## 2021-06-20 ENCOUNTER — Emergency Department (HOSPITAL_BASED_OUTPATIENT_CLINIC_OR_DEPARTMENT_OTHER): Payer: Medicaid Other | Admitting: Radiology

## 2021-06-20 DIAGNOSIS — R202 Paresthesia of skin: Secondary | ICD-10-CM | POA: Diagnosis not present

## 2021-06-20 DIAGNOSIS — M545 Low back pain, unspecified: Secondary | ICD-10-CM | POA: Insufficient documentation

## 2021-06-20 DIAGNOSIS — G8929 Other chronic pain: Secondary | ICD-10-CM | POA: Diagnosis not present

## 2021-06-20 LAB — PREGNANCY, URINE: Preg Test, Ur: NEGATIVE

## 2021-06-20 MED ORDER — HYDROMORPHONE HCL 1 MG/ML IJ SOLN
1.0000 mg | Freq: Once | INTRAMUSCULAR | Status: AC
  Administered 2021-06-20: 1 mg via INTRAMUSCULAR
  Filled 2021-06-20: qty 1

## 2021-06-20 MED ORDER — NAPROXEN 500 MG PO TBEC: 500.0000 mg | DELAYED_RELEASE_TABLET | Freq: Two times a day (BID) | ORAL | 0 refills | Status: DC | PRN

## 2021-06-20 MED ORDER — METHOCARBAMOL 500 MG PO TABS
500.0000 mg | ORAL_TABLET | Freq: Once | ORAL | Status: AC
  Administered 2021-06-20: 500 mg via ORAL
  Filled 2021-06-20: qty 1

## 2021-06-20 MED ORDER — METHOCARBAMOL 500 MG PO TABS: 500.0000 mg | ORAL_TABLET | Freq: Three times a day (TID) | ORAL | 0 refills | Status: DC | PRN
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
## 2021-06-20 NOTE — Discharge Instructions (Addendum)
Dear Ms. Keepers,  Thank you for trusting [place] with your care today. Today we evaluated you for worsening back pain.  We obtained x-rays of your lower back that showed no acute abnormality including no fractures or hernias.  We will provide you with some medication for pain control.  Please take 1 tablet of the naproxen by mouth 2 times daily as needed.  We will also provide you with Robaxin 500 mg to take by mouth every 8 hours as needed. Please continue to work with your physical therapist.  If your pain does not improve or continues to worsen please return to the emergency department.  We will also provide you with the contact information for an orthopedic surgeon to call as well should your symptoms worsen.

## 2021-06-20 NOTE — ED Notes (Signed)
Pt discharged to home.  Discharge instructions have been discussed with pt and/or family members.  Pt verbally acknowledges understanding of discharge instructions and endorses comprehension to check out at registration prior to leaving. 

## 2021-06-20 NOTE — ED Notes (Signed)
RT Note: Pt. was able to give Urine sample, labelled with Kelly Ly[facility]/Initialed/taken to Lab, RN made aware.
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
## 2021-07-28 ENCOUNTER — Encounter: Payer: Self-pay | Admitting: Physical Therapy

## 2021-07-28 ENCOUNTER — Other Ambulatory Visit: Payer: Self-pay

## 2021-07-28 ENCOUNTER — Ambulatory Visit: Payer: Medicaid Other | Attending: Orthopedic Surgery | Admitting: Physical Therapy

## 2021-07-28 DIAGNOSIS — M545 Low back pain, unspecified: Secondary | ICD-10-CM | POA: Diagnosis not present

## 2021-07-28 DIAGNOSIS — M6281 Muscle weakness (generalized): Secondary | ICD-10-CM | POA: Insufficient documentation

## 2021-07-28 DIAGNOSIS — G8929 Other chronic pain: Secondary | ICD-10-CM | POA: Insufficient documentation

## 2021-07-28 NOTE — Patient Instructions (Signed)
Access Code: [email or web address] URL: [email or web address] Date: 07/28/2021 Prepared by: Rosana Hoes  Exercises Cat Cow - 2-3 x daily - 7 x weekly - 10 reps Sidelying Hip Abduction - 1-2 x daily - 7 x weekly - 2 sets - 10 reps Prone Hip Extension with Bent Knee - One Pillow - 1-2 x daily - 7 x weekly - 2 sets - 10 reps - 3 hold Seated Pelvic Tilt - 2-3 x daily - 7 x weekly - 10 reps
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
## 2021-08-09 ENCOUNTER — Encounter: Payer: Self-pay | Admitting: Physical Therapy

## 2021-08-09 ENCOUNTER — Other Ambulatory Visit: Payer: Self-pay

## 2021-08-09 ENCOUNTER — Ambulatory Visit: Payer: Medicaid Other | Admitting: Physical Therapy

## 2021-08-09 DIAGNOSIS — M545 Low back pain, unspecified: Secondary | ICD-10-CM

## 2021-08-09 DIAGNOSIS — G8929 Other chronic pain: Secondary | ICD-10-CM

## 2021-08-09 DIAGNOSIS — M6281 Muscle weakness (generalized): Secondary | ICD-10-CM

## 2021-08-09 NOTE — Patient Instructions (Addendum)

## 2021-08-09 NOTE — Therapy (Signed)
[facility] Outpatient Rehabilitation [facility] [address] [place], [place], [address] Phone: [phone]   Fax:  [phone]  Physical Therapy Treatment  Patient Details  Name: Kelly Lyons MRN: [id number] Date of Birth: [birth year] Referring Provider (PT): Tanda Rockers, MD   Encounter Date: 08/09/2021   PT End of Session - 08/09/21 1234     Visit Number 2    Number of Visits 12    Date for PT Re-Evaluation 09/08/21    Authorization Type [employer]    PT Start Time 0800    PT Stop Time 0900    PT Time Calculation (min) 60 min             Past Medical History:  Diagnosis Date   Eczema    on face    Past Surgical History:  Procedure Laterality Date   SHOULDER ARTHROSCOPY  December   For severely dislocated shoulder    There were no vitals filed for this visit.   Subjective Assessment - 08/09/21 0805     Limitations Sitting;Lifting;Standing;Walking;House hold activities    Currently in Pain? Yes    Pain Score 4     Pain Location Back    Pain Orientation Lower    Pain Descriptors / Indicators Sharp    Pain Type Chronic pain    Pain Onset More than a month ago    Pain Frequency Constant                          added to HEP Access Code: [email or web address]: [email or web address]: 10/18/2022Prepared by: Wayland Denis BeardsleyExercises   Supine Lower Trunk Rotation - 2-3 x daily - 7 x weekly - 1 sets - 5 reps - 20 hold  Supine Piriformis Stretch with Leg Straight - 2-3 x daily - 7 x weekly - 1 sets - 5 reps - 10 hold    [facility] Adult PT Treatment/Exercise - 08/09/21 0001       Lumbar Exercises: Stretches   Lower Trunk Rotation 5 reps;20 seconds    Lower Trunk Rotation Limitations r and L  pt tolerates supine with knees bent    Piriformis Stretch 3 reps;30 seconds    Piriformis Stretch Limitations r and Left      Lumbar Exercises: Sidelying   Hip Abduction Right;Left;10 reps      Lumbar  Exercises: Prone   Other Prone Lumbar Exercises Bent knee hip extension x 10   single pillow under hips     Lumbar Exercises: Quadruped   Madcat/Old Horse 10 reps    Madcat/Old Horse Limitations x 2      Modalities   Modalities Moist Heat      Moist Heat Therapy   Number Minutes Moist Heat 15 Minutes    Moist Heat Location Lumbar Spine      Manual Therapy   Manual Therapy Soft tissue mobilization;Myofascial release    Manual therapy comments skilled palpation with TPDN    Soft tissue mobilization lumbar parspinals    Myofascial Release Left QL              Trigger Point Dry Needling - 08/09/21 0001     Consent Given? Yes    Education Handout Provided Yes    Muscles Treated Back/Hip Quadratus lumborum;Lumbar multifidi   left   Electrical Stimulation Performed with Dry Needling Yes    E-stim with Dry Needling Details 20 pps to pt tolerance intensity    Lumbar multifidi Response Twitch response  elicited;Palpable increased muscle length   Left Lumbar L-3 to S1   Quadratus Lumborum Response Twitch response elicited;Palpable increased muscle length                   PT Education - 08/09/21 0811     Education Details Educated on TPDN and aftercare /precautians  added supine trunk rotation, and supine piriformis stretch    Person(s) Educated Patient    Methods Explanation;Demonstration;Tactile cues;Verbal cues;Handout    Comprehension Verbalized understanding;Returned demonstration              PT Short Term Goals - 07/28/21 1113       PT SHORT TERM GOAL #1   Title Patient will be I with initial HEP to progress with PT    Baseline HEP provided at evaluation    Time 3    Period Weeks    Status New    Target Date 08/18/21      PT SHORT TERM GOAL #2   Title Patient will demonstrate lumbar AROM without pain or limitation in order to improve ability to perform household tasks    Baseline patient exhibits limitation and pain with lumbar motion    Time 3     Period Weeks    Status New    Target Date 08/18/21      PT SHORT TERM GOAL #3   Title Patient will be able to sit >/= 30 minutes without increase in pain to improve ability to work    Baseline patient unable to sit longer than 10 minutes    Time 3    Period Weeks    Status New    Target Date 08/18/21               PT Long Term Goals - 07/28/21 1113       PT LONG TERM GOAL #1   Title Patient will be I with final HEP to maintain progress from PT    Baseline HEP provided at evaluation    Time 6    Period Weeks    Status New    Target Date 09/08/21      PT LONG TERM GOAL #2   Title Patient will exhibit core and hip strength grossly >/= 4/5 MMT in order to improve lifting ability    Baseline core and hip strength grossly 3/5 MMT    Time 6    Period Weeks    Status New    Target Date 09/08/21      PT LONG TERM GOAL #3   Title Patient will report no limitation or increased pain with sitting or walking >/= 1 hour to improve work and community access    Baseline patient reports unable to sit more than 10 minutes and walk more than 20 minutes    Time 6    Period Weeks    Status New    Target Date 09/08/21                   Plan - 08/09/21 0841     Clinical Impression Statement Pt enters clinic with 4/10 pain on left QL/lumbar SI region.  Pt consents to TPDN and is closely monitored during session.  Pt with decreased muscle tension post TPDN. Pt able to lie supine and do lumbar/gluteal stretches.  Pt tolerated e stim with no adverse effects.  Pt reports decreased radicular symptoms post TPDN.  Pt reviewed HEP and given additional stretches to do at home.  Will  continue to progress as pt tolerates.    Personal Factors and Comorbidities Time since onset of injury/illness/exacerbation;Past/Current Experience    Examination-Activity Limitations Locomotion Level;Sit;Sleep;Lift;Dressing;Bend;Caring for Others;Carry    Examination-Participation Restrictions Meal  Prep;Cleaning;Occupation;Community Activity;Driving;Shop;Laundry;Yard Work    PT Frequency 2x / week    PT Duration 6 weeks    PT Treatment/Interventions ADLs/Self Care Home Management;Aquatic Therapy;Cryotherapy;Electrical Stimulation;Iontophoresis 4mg /ml Dexamethasone;Moist Heat;Traction;Ultrasound;Neuromuscular re-education;Balance training;Therapeutic exercise;Therapeutic activities;Functional mobility training;Stair training;Gait training;Patient/family education;Manual techniques;Dry needling;Passive range of motion;Taping;Vasopneumatic Device;Spinal Manipulations;Joint Manipulations    PT Next Visit Plan Assess TPDN go over body mechanics/lifting ( 25 yr old dtr)  and progress HEP PRN, manual/dry needling for lumbar paraspinals, progress core stabilization exercises, trial supine exercises as tolerated.    PT Home Exercise Plan    Consulted and Agree with Plan of Care Patient             Patient will benefit from skilled therapeutic intervention in order to improve the following deficits and impairments:  Decreased range of motion, Pain, Increased muscle spasms, Decreased activity tolerance, Postural dysfunction, Decreased strength  Visit Diagnosis: No diagnosis found.     Problem List Patient Active Problem List   Diagnosis Date Noted   Sickle cell trait (HCC) 08/16/2019   Indication for care in labor or delivery 08/12/2019    08/14/2019, PT, [facility] Certified Exercise Expert for the Aging Adult  08/09/21 12:38 PM Phone: [phone] Fax: [phone]   [facility] Outpatient Rehabilitation [facility] [address] [place], [place], [place] Phone: [phone]   Fax:  [phone]  Name: Duanna Runk MRN: [name] Date of Birth: Nov 17, 1995

## 2021-08-11 ENCOUNTER — Ambulatory Visit: Payer: Medicaid Other | Admitting: Physical Therapy

## 2021-08-16 ENCOUNTER — Encounter: Payer: Self-pay | Admitting: Physical Therapy

## 2021-08-16 ENCOUNTER — Ambulatory Visit: Payer: Medicaid Other | Admitting: Physical Therapy

## 2021-08-16 ENCOUNTER — Other Ambulatory Visit: Payer: Self-pay

## 2021-08-16 DIAGNOSIS — M545 Low back pain, unspecified: Secondary | ICD-10-CM | POA: Diagnosis not present

## 2021-08-16 DIAGNOSIS — G8929 Other chronic pain: Secondary | ICD-10-CM

## 2021-08-16 DIAGNOSIS — M6281 Muscle weakness (generalized): Secondary | ICD-10-CM

## 2021-08-16 NOTE — Therapy (Signed)
[facility] [address] [place], [place], [address] Phone: [phone]   Fax:  [phone]  Physical Therapy Treatment  Patient Details  Name: Kelly Lyons MRN: [id number] Date of Birth: 05/12/1996 Referring Provider (PT): Tanda Rockers, MD   Encounter Date: 08/16/2021   PT End of Session - 08/16/21 1033     Visit Number 3    Number of Visits 12    Date for PT Re-Evaluation 09/08/21    Authorization Type M[employer]    PT Start Time 0845    PT Stop Time 0929    PT Time Calculation (min) 44 min    Activity Tolerance Patient tolerated treatment well    Behavior During Therapy [facility] for tasks assessed/performed             Past Medical History:  Diagnosis Date   Eczema    on face    Past Surgical History:  Procedure Laterality Date   SHOULDER ARTHROSCOPY  December   For severely dislocated shoulder    There were no vitals filed for this visit.   Subjective Assessment - 08/16/21 0900     Subjective I have no pain and I did some exercise / My baby turned 2 on Sunday so i was cleaning and preparing for her party.  I did well.    Limitations Sitting;Lifting;Standing;Walking;House hold activities    How long can you sit comfortably? able to sti for 30 minutes but needs to get up and move    Diagnostic tests MRI - unknown results    Patient Stated Goals Get relief from pain and return to gym exercise    Currently in Pain? No/denies    Pain Score 0-No pain    Pain Location Back    Pain Orientation Lower    Pain Type Chronic pain                               [facility] Adult PT Treatment/Exercise - 08/16/21 0001       Self-Care   Self-Care ADL's;Lifting;Posture;Other Self-Care Comments    ADL's sweeping and mopping simulation    Lifting lifting 30# KB  squat with 15 # KB  VC and TC    Posture sitting standing and sleeping postures    Other Self-Care Comments  DOMS and  progressive strengthening      Exercises   Exercises Lumbar      Lumbar Exercises: Stretches   Lower Trunk Rotation 5 reps;20 seconds    Lower Trunk Rotation Limitations r and L  pt tolerates supine with knees bent    Piriformis Stretch 3 reps;30 seconds    Piriformis Stretch Limitations r and Left      Lumbar Exercises: Aerobic   Elliptical level 3 6 min      Lumbar Exercises: Standing   Other Standing Lumbar Exercises hip hinge with a dowel x 15 good form, with external cue of wall x 10, goblet squat 15 # 2 x 10 deadlift with 6 inch step 1 x 8 with 30#  then 2 x 8 with 30 # KB from ground.  in between back extension and walking      Lumbar Exercises: Seated   Other Seated Lumbar Exercises Pelvic tilt x 10      Lumbar Exercises: Supine   Other Supine Lumbar Exercises Pt with Left SI pain post RX. pt with decrease discomfort with resisted Left hamstring stretch  Lumbar Exercises: Sidelying   Hip Abduction Right;Left;10 reps    Hip Abduction Limitations toes IR for added challenge to glut med      Lumbar Exercises: Prone   Other Prone Lumbar Exercises Bent knee hip extension x 10      Lumbar Exercises: Quadruped   Madcat/Old Horse 10 reps    Madcat/Old Horse Limitations x 2                     PT Education - 08/16/21 0852     Education Details educated on lifting, posture and body mechanics and added to HEP hip hinge, deadlift ,goblet squat and farmers carry    [employer]) Educated Patient    Methods Explanation;Demonstration;Tactile cues;Verbal cues;Handout    Comprehension Verbalized understanding;Returned demonstration              PT Short Term Goals - 08/16/21 1027       PT SHORT TERM GOAL #1   Title Patient will be I with initial HEP to progress with PT    Baseline initial HEP I    Time 3    Period Weeks    Status Achieved    Target Date 08/18/21      PT SHORT TERM GOAL #2   Title Patient will demonstrate lumbar AROM without pain or  limitation in order to improve ability to perform household tasks    Baseline Pt with no limitation 08-16-21 will continue to monitor    Time 3    Period Weeks    Status On-going    Target Date 08/18/21      PT SHORT TERM GOAL #3   Title Patient will be able to sit >/= 30 minutes without increase in pain to improve ability to work    Baseline Pt reports no pain 08-16-21   TPDN beneficial    Time 3    Period Weeks    Status Achieved    Target Date 08/18/21               PT Long Term Goals - 07/28/21 1113       PT LONG TERM GOAL #1   Title Patient will be I with final HEP to maintain progress from PT    Baseline HEP provided at evaluation    Time 6    Period Weeks    Status New    Target Date 09/08/21      PT LONG TERM GOAL #2   Title Patient will exhibit core and hip strength grossly >/= 4/5 MMT in order to improve lifting ability    Baseline core and hip strength grossly 3/5 MMT    Time 6    Period Weeks    Status New    Target Date 09/08/21      PT LONG TERM GOAL #3   Title Patient will report no limitation or increased pain with sitting or walking >/= 1 hour to improve work and community access    Baseline patient reports unable to sit more than 10 minutes and walk more than 20 minutes    Time 6    Period Weeks    Status New    Target Date 09/08/21                   Plan - 08/16/21 0934     Clinical Impression Statement Pt enters clinic with 0/10 pain is ready for more strengthening in HEP.  Pt educated on lifting, ADL/body mechanics  and Posture for sleeping. standing and sitting and need for movement throughout day.  Pt declinced TPDN and will continue exercise and wait for subsequent visits for TPDN.  Pt able to lift 30# deadlift on 6 inch step and then from floor as well as VC/TC and mirror for input on form. Pt ends with supine and mat exercises.  Pt with some minimal irritation of  Left SI at end of sesssion , pt with decreased discomfort with  resited Hamstring at end of session.  will recheck at next visit. possible TPDN for L SI. Will monitor for continued sterngthening/progression in order to start working out at a gym.  STG # 1 and #3 achieved.  Able to sit for 30 minutes and i with HEP    Personal Factors and Comorbidities Time since onset of injury/illness/exacerbation;Past/Current Experience    Examination-Activity Limitations Locomotion Level;Sit;Sleep;Lift;Dressing;Bend;Caring for Others;Carry    Examination-Participation Restrictions Meal Prep;Cleaning;Occupation;Community Activity;Driving;Shop;Laundry;Yard Work    PT Treatment/Interventions ADLs/Self Care Home Management;Aquatic Therapy;Cryotherapy;Electrical Stimulation;Iontophoresis 4mg /ml Dexamethasone;Moist Heat;Traction;Ultrasound;Neuromuscular re-education;Balance training;Therapeutic exercise;Therapeutic activities;Functional mobility training;Stair training;Gait training;Patient/family education;Manual techniques;Dry needling;Passive range of motion;Taping;Vasopneumatic Device;Spinal Manipulations;Joint Manipulations    PT Next Visit Plan check Left SI joint if needed.   continue strength training for back , TPDN as needed  review HEP and modify as needed    PT Home Exercise Plan    Consulted and Agree with Plan of Care Patient             Patient will benefit from skilled therapeutic intervention in order to improve the following deficits and impairments:  Decreased range of motion, Pain, Increased muscle spasms, Decreased activity tolerance, Postural dysfunction, Decreased strength  Visit Diagnosis: Chronic bilateral low back pain, unspecified whether sciatica present  Muscle weakness (generalized)     Problem List Patient Active Problem List   Diagnosis Date Noted   Sickle cell trait (HCC) 08/16/2019   Indication for care in labor or delivery 08/12/2019    08/14/2019, PT, [facility] Certified Exercise Expert for the Aging Adult  08/16/21  10:34 AM Phone: [phone] Fax: [phone]   [facility] Out[facility] [address] [place], [place], [place] Phone: [phone]   Fax:  [phone]  Name: Kelly Lyons MRN: [name] Date of Birth: July 22, 1996

## 2021-08-16 NOTE — Patient Instructions (Addendum)
Sleeping on Back  Place pillow under knees. A pillow with cervical support and a roll around waist are also helpful. Copyright  VHI. All rights reserved.  Sleeping on Side Place pillow between knees. Use cervical support under neck and a roll around waist as needed. Copyright  VHI. All rights reserved.   Sleeping on Stomach   If this is the only desirable sleeping position, place pillow under lower legs, and under stomach or chest as needed.  Posture - Sitting   Sit upright, head facing forward. Try using a roll to support lower back. Keep shoulders relaxed, and avoid rounded back. Keep hips level with knees. Avoid crossing legs for long periods. Stand to Sit / Sit to Stand   To sit: Bend knees to lower self onto front edge of chair, then scoot back on seat. To stand: Reverse sequence by placing one foot forward, and scoot to front of seat. Use rocking motion to stand up.   Work Height and Reach  Ideal work height is no more than 2 to 4 inches below elbow level when standing, and at elbow level when sitting. Reaching should be limited to arm's length, with elbows slightly bent.  Bending  Bend at hips and knees, not back. Keep feet shoulder-width apart.    Posture - Standing   Good posture is important. Avoid slouching and forward head thrust. Maintain curve in low back and align ears over shoul- ders, hips over ankles.  Alternating Positions   Alternate tasks and change positions frequently to reduce fatigue and muscle tension. Take rest breaks. Computer Work   Position work to Programmer, multimedia. Use proper work and seat height. Keep shoulders back and down, wrists straight, and elbows at right angles. Use chair that provides full back support. Add footrest and lumbar roll as needed.  Getting Into / Out of Car  Lower self onto seat, scoot back, then bring in one leg at a time. Reverse sequence to get out.  Dressing  Lie on back to pull socks or slacks over feet, or sit  and bend leg while keeping back straight.    Housework - Sink  Place one foot on ledge of cabinet under sink when standing at sink for prolonged periods.   Pushing / Pulling  Pushing is preferable to pulling. Keep back in proper alignment, and use leg muscles to do the work.  Deep Squat   Squat and lift with both arms held against upper trunk. Tighten stomach muscles without holding breath. Use smooth movements to avoid jerking.  Avoid Twisting   Avoid twisting or bending back. Pivot around using foot movements, and bend at knees if needed when reaching for articles.  Carrying Luggage   Distribute weight evenly on both sides. Use a cart whenever possible. Do not twist trunk. Move body as a unit.   Lifting Principles Maintain proper posture and head alignment. Slide object as close as possible before lifting. Move obstacles out of the way. Test before lifting; ask for help if too heavy. Tighten stomach muscles without holding breath. Use smooth movements; do not jerk. Use legs to do the work, and pivot with feet. Distribute the work load symmetrically and close to the center of trunk. Push instead of pull whenever possible.   Ask For Help   Ask for help and delegate to others when possible. Coordinate your movements when lifting together, and maintain the low back curve.  Log Roll   Lying on back, bend left knee and place left  arm across chest. Roll all in one movement to the right. Reverse to roll to the left. Always move as one unit. Housework - Sweeping  Use long-handled equipment to avoid stooping.   Housework - Wiping  Position yourself as close as possible to reach work surface. Avoid straining your back.  Laundry - Unloading Wash   To unload small items at bottom of washer, lift leg opposite to arm being used to reach.  Gardening - Raking  Move close to area to be raked. Use arm movements to do the work. Keep back straight and avoid twisting.      Cart  When reaching into cart with one arm, lift opposite leg to keep back straight.   Getting Into / Out of Bed  Lower self to lie down on one side by raising legs and lowering head at the same time. Use arms to assist moving without twisting. Bend both knees to roll onto back if desired. To sit up, start from lying on side, and use same move-ments in reverse. Housework - Vacuuming  Hold the vacuum with arm held at side. Step back and forth to move it, keeping head up. Avoid twisting.   Laundry - Armed forces training and education officer so that bending and twisting can be avoided.   Laundry - Unloading Dryer  Squat down to reach into clothes dryer or use a reacher.  Gardening - Weeding / Psychiatric nurse or Kneel. Knee pads may be helpful.                     Added to HEP Access Code: [email or web address]: [email or web address]: 10/25/2022Prepared by: Wayland Denis BeardsleyExercises   Standing Hip Hinge with Dowel - 1 x daily - 7 x weekly - 10 reps - 3 sets  Goblet Squat with Kettlebell - 1 x daily - 7 x weekly - 10 reps - 3 sets  Kettlebell Deadlift - 1 x daily - 7 x weekly - 3 sets - 8 reps  Farmer's Carry with Kettlebells - 1 x daily - 7 x weekly - 2 sets - 8 reps   Garen Lah, PT, [facility] Certified Exercise Expert for the Aging Adult  08/16/21 8:45 AM Phone: [phone] Fax: [phone]

## 2021-08-18 ENCOUNTER — Encounter: Payer: Self-pay | Admitting: Physical Therapy

## 2021-08-18 ENCOUNTER — Other Ambulatory Visit: Payer: Self-pay

## 2021-08-18 ENCOUNTER — Ambulatory Visit: Payer: Medicaid Other | Admitting: Physical Therapy

## 2021-08-18 DIAGNOSIS — G8929 Other chronic pain: Secondary | ICD-10-CM

## 2021-08-18 DIAGNOSIS — M6281 Muscle weakness (generalized): Secondary | ICD-10-CM

## 2021-08-18 DIAGNOSIS — M545 Low back pain, unspecified: Secondary | ICD-10-CM | POA: Diagnosis not present

## 2021-08-18 NOTE — Therapy (Signed)
[facility] Outpatient Rehabilitation [facility] [address] [place], [place], [address] Phone: [phone]   Fax:  [phone]  Physical Therapy Treatment  Patient Details  Name: Kelly Lyons MRN: [id number] Date of Birth: October 08, 1996 Referring Provider (PT): Tanda Rockers, MD   Encounter Date: 08/18/2021   PT End of Session - 08/18/21 0836     Visit Number 4    Number of Visits 12    Date for PT Re-Evaluation 09/08/21    Authorization Type M[employer]    PT Start Time 0831    PT Stop Time 0915    PT Time Calculation (min) 44 min    Activity Tolerance Patient tolerated treatment well    Behavior During Therapy [facility] for tasks assessed/performed             Past Medical History:  Diagnosis Date   Eczema    on face    Past Surgical History:  Procedure Laterality Date   SHOULDER ARTHROSCOPY  December   For severely dislocated shoulder    There were no vitals filed for this visit.   Subjective Assessment - 08/18/21 0835     Subjective Patient reports she feels much better with the needling. She can tell a difference between the sides she was needled in. She has been working on her lifting and practicing using good form.    Patient Stated Goals Get relief from pain and return to gym exercise    Currently in Pain? No/denies                [facility] PT Assessment - 08/18/21 0001       Strength   Right Hip Extension 4-/5    Right Hip ABduction 4-/5    Left Hip Extension 4-/5    Left Hip ABduction 4-/5                      [facility] Adult PT Treatment/Exercise:  Therapeutic Exercise: Elliptical L5 R1 x 5 min while taking subjective LTR x 10 90-90 alternating leg extension 2 x 10 Bridge with 5 sec hold 2 x 10 Modified side plank 3 x 20 sec each Bird dog x 10 Deadlift 30# 3 x 8 Pallof press with red power band 2 x 10 each  Manual Therapy: N/A  Neuromuscular re-ed: N/A  Therapeutic  Activity: N/A  Modalities: N/A  Self Care: N/A               PT Education - 08/18/21 0836     Education Details HEP    Person(s) Educated Patient    Methods Explanation;Demonstration;Verbal cues;Tactile cues    Comprehension Verbalized understanding;Returned demonstration;Verbal cues required;Need further instruction;Tactile cues required              PT Short Term Goals - 08/16/21 1027       PT SHORT TERM GOAL #1   Title Patient will be I with initial HEP to progress with PT    Baseline initial HEP I    Time 3    Period Weeks    Status Achieved    Target Date 08/18/21      PT SHORT TERM GOAL #2   Title Patient will demonstrate lumbar AROM without pain or limitation in order to improve ability to perform household tasks    Baseline Pt with no limitation 08-16-21 will continue to monitor    Time 3    Period Weeks    Status On-going  Target Date 08/18/21      PT SHORT TERM GOAL #3   Title Patient will be able to sit >/= 30 minutes without increase in pain to improve ability to work    Baseline Pt reports no pain 08-16-21   TPDN beneficial    Time 3    Period Weeks    Status Achieved    Target Date 08/18/21               PT Long Term Goals - 07/28/21 1113       PT LONG TERM GOAL #1   Title Patient will be I with final HEP to maintain progress from PT    Baseline HEP provided at evaluation    Time 6    Period Weeks    Status New    Target Date 09/08/21      PT LONG TERM GOAL #2   Title Patient will exhibit core and hip strength grossly >/= 4/5 MMT in order to improve lifting ability    Baseline core and hip strength grossly 3/5 MMT    Time 6    Period Weeks    Status New    Target Date 09/08/21      PT LONG TERM GOAL #3   Title Patient will report no limitation or increased pain with sitting or walking >/= 1 hour to improve work and community access    Baseline patient reports unable to sit more than 10 minutes and walk more than  20 minutes    Time 6    Period Weeks    Status New    Target Date 09/08/21                   Plan - 08/18/21 0839     Clinical Impression Statement Patient tolerated therapy well with no adverse effects. Therapy continues primary focus on progressing core/hip strengthening and stabilization with lifting. She does require occasional cueing for proper lifting mechanics but able to correct and perform correctly when cued. She demonstrates improved strength compared to eval and did not report any increase in pain with exercise this visit. No changes to HEP this visit. Patient would benefit from continued skilled PT to progress her strength and functional mobility in order to reduce pain and return to prior level of function.    PT Treatment/Interventions ADLs/Self Care Home Management;Aquatic Therapy;Cryotherapy;Electrical Stimulation;Iontophoresis 4mg /ml Dexamethasone;Moist Heat;Traction;Ultrasound;Neuromuscular re-education;Balance training;Therapeutic exercise;Therapeutic activities;Functional mobility training;Stair training;Gait training;Patient/family education;Manual techniques;Dry needling;Passive range of motion;Taping;Vasopneumatic Device;Spinal Manipulations;Joint Manipulations    PT Next Visit Plan check Left SI joint if needed.   continue strength training for back , TPDN as needed  review HEP and modify as needed    PT Home Exercise Plan    Consulted and Agree with Plan of Care Patient             Patient will benefit from skilled therapeutic intervention in order to improve the following deficits and impairments:  Decreased range of motion, Pain, Increased muscle spasms, Decreased activity tolerance, Postural dysfunction, Decreased strength  Visit Diagnosis: Chronic bilateral low back pain, unspecified whether sciatica present  Muscle weakness (generalized)     Problem List Patient Active Problem List   Diagnosis Date Noted   Sickle cell trait (HCC)  08/16/2019   Indication for care in labor or delivery 08/12/2019    08/14/2019, PT, DPT, LAT, ATC 08/18/21  9:16 AM Phone: [phone] Fax: [phone]   [facility] Out[facility] [address]  [address] [place], [place], [address] Phone: [phone]   Fax:  [phone]  Name: Kelly Lyons MRN: [id number] Date of Birth: [birth year]

## 2021-08-22 ENCOUNTER — Ambulatory Visit: Payer: Medicaid Other | Admitting: Physical Therapy

## 2021-08-25 ENCOUNTER — Ambulatory Visit: Payer: Medicaid Other | Attending: Orthopedic Surgery | Admitting: Physical Therapy

## 2021-08-25 ENCOUNTER — Other Ambulatory Visit: Payer: Self-pay

## 2021-08-25 ENCOUNTER — Encounter: Payer: Self-pay | Admitting: Physical Therapy

## 2021-08-25 DIAGNOSIS — M6281 Muscle weakness (generalized): Secondary | ICD-10-CM | POA: Insufficient documentation

## 2021-08-25 DIAGNOSIS — G8929 Other chronic pain: Secondary | ICD-10-CM | POA: Insufficient documentation

## 2021-08-25 DIAGNOSIS — M545 Low back pain, unspecified: Secondary | ICD-10-CM | POA: Diagnosis present

## 2021-08-25 NOTE — Patient Instructions (Signed)
Access Code: [email or web address] URL: [email or web address] Date: 08/25/2021 Prepared by: Rosana Hoes  Exercises Cat Cow - 1 x daily - 7 x weekly - 10 reps Seated Pelvic Tilt - 1 x daily - 7 x weekly - 10 reps Sidelying Thoracic Lumbar Rotation - 1 x daily - 7 x weekly - 10 reps - 5 hold Single Leg Bridge - 1 x daily - 7 x weekly - 3 sets - 6 reps - 5 hold Supine Dead Bug with Leg Extension - 1 x daily - 7 x weekly - 2 sets - 10 reps Side Plank on Knees - 1 x daily - 7 x weekly - 3 reps - 30 hold Sidelying Hip Abduction - 1 x daily - 7 x weekly - 2 sets - 10 reps Bird Dog - 1 x daily - 7 x weekly - 2 sets - 10 reps - 5 hold Standing Anti-Rotation Press with Anchored Resistance - 1 x daily - 7 x weekly - 2 sets - 10 reps - 5 hold Standing Hip Hinge with Dowel - 1 x daily - 7 x weekly - 10 reps Goblet Squat with Kettlebell - 1 x daily - 7 x weekly - 3 sets - 10 reps Kettlebell Deadlift - 1 x daily - 7 x weekly - 3 sets - 8 reps Farmer's Carry with Kettlebells - 1 x daily - 7 x weekly - 2 sets - 8 reps

## 2021-08-25 NOTE — Therapy (Signed)
[facility] Out[facility] Rehabilitation [facility] [address] [place], [place], [address] Phone: [phone]   Fax:  [phone]  Physical Therapy Treatment  Patient Details  Name: Kelly Lyons MRN: [id number] Date of Birth: 1996/05/22 Referring Provider (PT): Tanda Rockers, MD   Encounter Date: 08/25/2021   PT End of Session - 08/25/21 0834     Visit Number 5    Number of Visits 12    Date for PT Re-Evaluation 09/08/21    Authorization Type MCD [employer]    Authorization Time Period pending    PT Start Time 0830    PT Stop Time 0915    PT Time Calculation (min) 45 min    Activity Tolerance Patient tolerated treatment well    Behavior During Therapy [facility] for tasks assessed/performed             Past Medical History:  Diagnosis Date   Eczema    on face    Past Surgical History:  Procedure Laterality Date   SHOULDER ARTHROSCOPY  December   For severely dislocated shoulder    There were no vitals filed for this visit.   Subjective Assessment - 08/25/21 0830     Subjective Patient reports she is doing well, no new issues. She reports exercises are going well, and she focuses on lifting correctly. She does report her left side feels much looser following needling.    Patient Stated Goals Get relief from pain and return to gym exercise    Currently in Pain? No/denies                [facility] PT Assessment - 08/25/21 0001       AROM   Overall AROM Comments Lumbar AROM grossly WFL and non-painful                     [facility] Adult PT Treatment/Exercise:   Therapeutic Exercise: Recumbent bike L1 x 5 min while taking subjective Sidelying thoracic rotation 5 x 5 sec 90-90 alternating leg extension x 10 Dead bug 2 x 10 Bridge with 5 sec hold x 10 SL bridge with opposite knee to chest 2 x 6 each Modified side plank 3 x 30 sec each Bird dog x 10 Deadlift 30# kettlebell x 10, with 30# bar x 10 - focus on keeping bar close  by sliding it up/down legs Pallof press with green band 2 x 10 with 5 sec hold each   Manual Therapy: N/A   Neuromuscular re-ed: N/A   Therapeutic Activity: N/A   Modalities: N/A   Self Care: N/A               PT Education - 08/25/21 0832     Education Details HEP update    Person(s) Educated Patient    Methods Explanation;Demonstration;Verbal cues;Handout    Comprehension Verbalized understanding;Returned demonstration;Verbal cues required;Need further instruction              PT Short Term Goals - 08/25/21 1308       PT SHORT TERM GOAL #1   Title Patient will be I with initial HEP to progress with PT    Baseline initial HEP I    Time 3    Period Weeks    Status Achieved    Target Date 08/18/21      PT SHORT TERM GOAL #2   Title Patient will demonstrate lumbar AROM without pain or limitation in order to improve ability to perform household tasks  Baseline Pt reports no limitation with lumbar motion    Time 3    Period Weeks    Status Achieved    Target Date 08/18/21      PT SHORT TERM GOAL #3   Title Patient will be able to sit >/= 30 minutes without increase in pain to improve ability to work    Baseline Pt reports no pain 08-16-21   TPDN beneficial    Time 3    Period Weeks    Status Achieved    Target Date 08/18/21               PT Long Term Goals - 07/28/21 1113       PT LONG TERM GOAL #1   Title Patient will be I with final HEP to maintain progress from PT    Baseline HEP provided at evaluation    Time 6    Period Weeks    Status New    Target Date 09/08/21      PT LONG TERM GOAL #2   Title Patient will exhibit core and hip strength grossly >/= 4/5 MMT in order to improve lifting ability    Baseline core and hip strength grossly 3/5 MMT    Time 6    Period Weeks    Status New    Target Date 09/08/21      PT LONG TERM GOAL #3   Title Patient will report no limitation or increased pain with sitting or walking >/= 1  hour to improve work and community access    Baseline patient reports unable to sit more than 10 minutes and walk more than 20 minutes    Time 6    Period Weeks    Status New    Target Date 09/08/21                   Plan - 08/25/21 0834     Clinical Impression Statement Patient tolerated therapy well with no adverse effects. Therapy continued focus on progressing core strength and lifting ability. She is progressing well with exercises and able to update HEP this visit to progress mat/floor based exercises. She does continue to exhibit difficulty maintaining neutral lumbar spine with supine exercises and require cue with deadlifts to avoid having weight too far in front of body. Patient reported the left side of her back/hips feel much looser from the right so would like dry needling for the right at next visit. Patient would benefit from continued skilled PT to progress her strength and functional mobility in order to reduce pain and return to prior level of function.    PT Treatment/Interventions ADLs/Self Care Home Management;Aquatic Therapy;Cryotherapy;Electrical Stimulation;Iontophoresis 4mg /ml Dexamethasone;Moist Heat;Traction;Ultrasound;Neuromuscular re-education;Balance training;Therapeutic exercise;Therapeutic activities;Functional mobility training;Stair training;Gait training;Patient/family education;Manual techniques;Dry needling;Passive range of motion;Taping;Vasopneumatic Device;Spinal Manipulations;Joint Manipulations    PT Next Visit Plan Review HEP and progress PRN, continue strength training for back, TPDN for right    PT Home Exercise Plan    Consulted and Agree with Plan of Care Patient             Patient will benefit from skilled therapeutic intervention in order to improve the following deficits and impairments:  Decreased range of motion, Pain, Increased muscle spasms, Decreased activity tolerance, Postural dysfunction, Decreased strength  Visit  Diagnosis: Chronic bilateral low back pain, unspecified whether sciatica present  Muscle weakness (generalized)     Problem List Patient Active Problem List   Diagnosis Date Noted   Sickle  cell trait (HCC) 08/16/2019   Indication for care in labor or delivery 08/12/2019    Rosana Hoes, PT, DPT, LAT, ATC 08/25/21  1:10 PM Phone: [phone] Fax: [phone]   [facility] Outpatient Rehabilitation [facility] [address] [place], [place], [address] Phone: [phone]   Fax:  [phone]  Name: Kelly Lyons MRN: [id number] Date of Birth: Jul 31, 1996

## 2021-08-30 ENCOUNTER — Ambulatory Visit: Payer: Medicaid Other | Admitting: Physical Therapy

## 2021-08-30 ENCOUNTER — Other Ambulatory Visit: Payer: Self-pay

## 2021-08-30 ENCOUNTER — Encounter: Payer: Self-pay | Admitting: Physical Therapy

## 2021-08-30 DIAGNOSIS — M545 Low back pain, unspecified: Secondary | ICD-10-CM | POA: Diagnosis not present

## 2021-08-30 DIAGNOSIS — M6281 Muscle weakness (generalized): Secondary | ICD-10-CM

## 2021-08-30 NOTE — Therapy (Signed)
[facility] Outpatient Rehabilitation [facility] [address] [place], [place], [address] Phone: [phone]   Fax:  [phone]  Physical Therapy Treatment  Patient Details  Name: Kelly Lyons MRN: [id number] Date of Birth: Feb 20, 1996 Referring Provider (PT): Tanda Rockers, MD   Encounter Date: 08/30/2021   PT End of Session - 08/30/21 0845     Visit Number 6    Number of Visits 12    Date for PT Re-Evaluation 09/08/21    Authorization Type MCD [employer]    PT Start Time 0802    PT Stop Time 0850    PT Time Calculation (min) 48 min    Activity Tolerance Patient tolerated treatment well    Behavior During Therapy [facility] for tasks assessed/performed             Past Medical History:  Diagnosis Date   Eczema    on face    Past Surgical History:  Procedure Laterality Date   SHOULDER ARTHROSCOPY  December   For severely dislocated shoulder    There were no vitals filed for this visit.   Subjective Assessment - 08/30/21 0805     Subjective sometimes I have a little bit of discomfort but I try to stretch and walking it off helps me.  When I get out of the car I felt something but now I am on the elliptical I am feeling better    Limitations Sitting;Lifting;Standing;Walking;House hold activities    How long can you sit comfortably? unlimited now    How long can you walk comfortably? hard to get my walks in but I can walk 30 minutes    Diagnostic tests MRI - unknown results    Patient Stated Goals Get relief from pain and return to gym exercise    Currently in Pain? No/denies    Pain Score 1     Pain Location Back    Pain Orientation Lower    Pain Descriptors / Indicators Sharp    Pain Type Chronic pain              [facility] Adult PT Treatment/Exercise:   Therapeutic Exercise: Elliptical L1 x 5 min while taking subjective Supine piriformis stretch on R post TPDN to R gluts and Lumbar Sidelying thoracic rotation 5 x 5 sec Sidelying  hip abduction 2 x 10 on R and L Deadlift 25# kettlebell x 8, - then 45# KB x 8  rest then additional 6 x with 45# KB  VC and TC for proper form 90-90 alternating leg extension x 10 Dead bug 2 x 10 Bridge with 5 sec hold x 10 using 15# KB SL bridge with opposite knee to chest 2 x 6 each Modified side plank 3 x 30 sec each    Manual Therapy: STW to R gluteals piriformis and R lumbar post TPDN ( see TPDN specifics)   Neuromuscular re-ed: N/A   Therapeutic Activity: N/A   Modalities: N/A   Self Care: N/A   Not done this visit  with 30#  lift bar x 10  focus on keeping bar close by sliding it up/down legs Bird dog x 10 Pallof press with green band 2 x 10 with 5 sec hold each                 Trigger Point Dry Needling - 08/30/21 0001     Consent Given? Yes    Education Handout Provided Previously provided    Muscles Treated Back/Hip Gluteus minimus;Gluteus medius;Lumbar multifidi;Piriformis;Gluteus  maximus   Right only   Gluteus Minimus Response Twitch response elicited;Palpable increased muscle length    Gluteus Medius Response Twitch response elicited;Palpable increased muscle length    Gluteus Maximus Response Twitch response elicited;Palpable increased muscle length    Piriformis Response Twitch response elicited;Palpable increased muscle length    Lumbar multifidi Response Twitch response elicited;Palpable increased muscle length   L-4 to S1                  PT Education - 08/30/21 0859     Education Details HEP update with piriformis stretch reinforcement of HEP    Person(s) Educated Patient    Methods Explanation;Demonstration;Tactile cues;Verbal cues;Handout    Comprehension Verbalized understanding;Returned demonstration              PT Short Term Goals - 08/25/21 1308       PT SHORT TERM GOAL #1   Title Patient will be I with initial HEP to progress with PT    Baseline initial HEP I    Time 3    Period Weeks    Status Achieved     Target Date 08/18/21      PT SHORT TERM GOAL #2   Title Patient will demonstrate lumbar AROM without pain or limitation in order to improve ability to perform household tasks    Baseline Pt reports no limitation with lumbar motion    Time 3    Period Weeks    Status Achieved    Target Date 08/18/21      PT SHORT TERM GOAL #3   Title Patient will be able to sit >/= 30 minutes without increase in pain to improve ability to work    Baseline Pt reports no pain 08-16-21   TPDN beneficial    Time 3    Period Weeks    Status Achieved    Target Date 08/18/21               PT Long Term Goals - 07/28/21 1113       PT LONG TERM GOAL #1   Title Patient will be I with final HEP to maintain progress from PT    Baseline HEP provided at evaluation    Time 6    Period Weeks    Status New    Target Date 09/08/21      PT LONG TERM GOAL #2   Title Patient will exhibit core and hip strength grossly >/= 4/5 MMT in order to improve lifting ability    Baseline core and hip strength grossly 3/5 MMT    Time 6    Period Weeks    Status New    Target Date 09/08/21      PT LONG TERM GOAL #3   Title Patient will report no limitation or increased pain with sitting or walking >/= 1 hour to improve work and community access    Baseline patient reports unable to sit more than 10 minutes and walk more than 20 minutes    Time 6    Period Weeks    Status New    Target Date 09/08/21                   Plan - 08/30/21 0847     Clinical Impression Statement Pt tolerated session well and requested TPDN on irritating area in R buttock.  Pt wanting to join [employer] near her home to continue strengthening. Pt 1/10 before session but 0/10 at  end of session.  Pt was closely monitored throughout session.  PT emphasized form with lifting and pt continued to reinforce HEP.  Pt may be ready for DC next session if independent with HEP and achievement of goals.  Pt tend to have increased lordosis  and difficulty maintaining neutral spine.  Pt tend to increase lever arm with KB weights for Deadlift and benefits from VC. Will check goals next session and determine readiness for DC    Personal Factors and Comorbidities Time since onset of injury/illness/exacerbation;Past/Current Experience    Examination-Activity Limitations Locomotion Level;Sit;Sleep;Lift;Dressing;Bend;Caring for Others;Carry    Examination-Participation Restrictions Meal Prep;Cleaning;Occupation;Community Activity;Driving;Shop;Laundry;Yard Work    PT Frequency 2x / week    PT Duration 6 weeks    PT Treatment/Interventions ADLs/Self Care Home Management;Aquatic Therapy;Cryotherapy;Electrical Stimulation;Iontophoresis 4mg /ml Dexamethasone;Moist Heat;Traction;Ultrasound;Neuromuscular re-education;Balance training;Therapeutic exercise;Therapeutic activities;Functional mobility training;Stair training;Gait training;Patient/family education;Manual techniques;Dry needling;Passive range of motion;Taping;Vasopneumatic Device;Spinal Manipulations;Joint Manipulations    PT Next Visit Plan reinforce HEP, check on GOAL's and assess need for further PT / DC/joining gym with HEP    PT Home Exercise Plan    Consulted and Agree with Plan of Care Patient             Patient will benefit from skilled therapeutic intervention in order to improve the following deficits and impairments:  Decreased range of motion, Pain, Increased muscle spasms, Decreased activity tolerance, Postural dysfunction, Decreased strength  Visit Diagnosis: Chronic bilateral low back pain, unspecified whether sciatica present  Muscle weakness (generalized)     Problem List Patient Active Problem List   Diagnosis Date Noted   Sickle cell trait (HCC) 08/16/2019   Indication for care in labor or delivery 08/12/2019    08/14/2019, PT, [facility] Certified Exercise Expert for the Aging Adult  08/30/21 9:00 AM Phone: [phone] Fax: [phone]    [facility] Outpatient Rehabilitation [facility] [address] [place], [place], [place] Phone: [phone]   Fax:  [phone]  Name: Nabila Albarracin MRN: [name] Date of Birth: 25-May-1996

## 2021-08-30 NOTE — Patient Instructions (Signed)
Access Code: [email or web address]: [email or web address]: 11/08/2022Prepared by: Wayland Denis BeardsleyExercises   Supine Piriformis Stretch - 1 x daily - 7 x weekly - 1 sets - 3 reps - 30-60 sec hold   Garen Lah, PT, [facility] Certified Exercise Expert for the Aging Adult  08/30/21 8:25 AM Phone: [phone] Fax: [phone]

## 2021-09-01 ENCOUNTER — Encounter: Payer: Self-pay | Admitting: Physical Therapy

## 2021-09-01 ENCOUNTER — Ambulatory Visit: Payer: Medicaid Other | Admitting: Physical Therapy

## 2021-09-01 ENCOUNTER — Other Ambulatory Visit: Payer: Self-pay

## 2021-09-01 DIAGNOSIS — M545 Low back pain, unspecified: Secondary | ICD-10-CM | POA: Diagnosis not present

## 2021-09-01 DIAGNOSIS — M6281 Muscle weakness (generalized): Secondary | ICD-10-CM

## 2021-09-01 DIAGNOSIS — G8929 Other chronic pain: Secondary | ICD-10-CM

## 2021-09-01 NOTE — Patient Instructions (Signed)
Access Code: [email or web address] URL: [email or web address] Date: 09/01/2021 Prepared by: Rosana Hoes  Exercises Cat Cow - 1 x daily - 7 x weekly - 10 reps Seated Pelvic Tilt - 1 x daily - 7 x weekly - 10 reps Supine Piriformis Stretch - 1 x daily - 7 x weekly - 1 sets - 3 reps - 30-60 sec hold Sidelying Thoracic Lumbar Rotation - 1 x daily - 7 x weekly - 10 reps - 5 hold Single Leg Bridge - 1 x daily - 7 x weekly - 3 sets - 6 reps - 5 hold Supine Dead Bug with Leg Extension - 1 x daily - 7 x weekly - 2 sets - 10 reps Side Plank on Knees - 1 x daily - 7 x weekly - 3 reps - 30 hold Sidelying Hip Abduction - 1 x daily - 7 x weekly - 2 sets - 10 reps Bird Dog - 1 x daily - 7 x weekly - 2 sets - 10 reps - 5 hold Standing Anti-Rotation Press with Anchored Resistance - 1 x daily - 7 x weekly - 2 sets - 10 reps - 5 hold Goblet Squat with Kettlebell - 1 x daily - 7 x weekly - 3 sets - 10 reps Kettlebell Deadlift - 1 x daily - 7 x weekly - 3 sets - 8 reps Farmer's Carry with Kettlebells - 1 x daily - 7 x weekly - 2 sets - 8 reps

## 2021-09-01 NOTE — Therapy (Signed)
[facility] [place], [place], [address] Phone: [phone]   Fax:  [phone]  Physical Therapy Treatment / Discharge  Patient Details  Name: Kelly Lyons MRN: [id number] Date of Birth: February 12, 1996 Referring Provider (PT): [place] Shi, MD   Encounter Date: 09/01/2021   PT End of Session - 09/01/21 0834     Visit Number 7    Number of Visits 12    Date for PT Re-Evaluation 09/08/21    Authorization Type MCD [employer]    Authorization Time Period 08/18/21 - 09/21/21    Authorization - Visit Number 4    Authorization - Number of Visits 12    PT Start Time 0833    PT Stop Time 0912    PT Time Calculation (min) 39 min    Activity Tolerance Patient tolerated treatment well    Behavior During Therapy [facility] for tasks assessed/performed             Past Medical History:  Diagnosis Date   Eczema    on face    Past Surgical History:  Procedure Laterality Date   SHOULDER ARTHROSCOPY  December   For severely dislocated shoulder    There were no vitals filed for this visit.   Subjective Assessment - 09/01/21 0837     Subjective Patient reports everything is going well, states the dry needling last visit helped to even things out. She plans to go to the gym to continue her strengthening program.    How long can you sit comfortably? no limitation at this point, will still get up and move around every 2 hours    How long can you walk comfortably? no limitation at this point    Patient Stated Goals Get relief from pain and return to gym exercise    Currently in Pain? No/denies                [facility] PT Assessment - 09/01/21 0001       Assessment   Medical Diagnosis Muscle spasm of back    Referring Provider (PT) Everrett Coombe, Derry Lory, MD      Precautions   Precautions None      Restrictions   Weight Bearing Restrictions No      Balance Screen   Has the patient fallen in the past 6  months No      Observation/Other Assessments   Focus on Therapeutic Outcomes (FOTO)  NA - MCD      Strength   Overall Strength Comments Core strength grossly 4/5 MMT    Right Hip Extension 4/5    Right Hip ABduction 4/5    Left Hip Extension 4/5    Left Hip ABduction 4/5                   [facility] Adult PT Treatment/Exercise:  Therapeutic Exercise: Elliptical L5 x 5 min while taking subjective SL bridge 2 x 6 each Dead bug 2 x 10 Modified side plank on knees 2 x 30 sec Sidelying hip abduction 2 x 10 Bird dog 2 x 10 Pallof press with red power band 2 x 10 each  Deadlift with 45# 3 x 6  Manual Therapy: N/A  Neuromuscular re-ed: N/A  Therapeutic Activity: N/A  Modalities: N/A  Self Care: See patient education                  PT Education - 09/01/21 0834     Education Details POC discharge,  progress toward goals, HEP and progressing strength at gym    Person(s) Educated Patient    Methods Explanation;Demonstration;Verbal cues    Comprehension Verbalized understanding;Returned demonstration;Verbal cues required;Need further instruction              PT Short Term Goals - 08/25/21 1308       PT SHORT TERM GOAL #1   Title Patient will be I with initial HEP to progress with PT    Baseline initial HEP I    Time 3    Period Weeks    Status Achieved    Target Date 08/18/21      PT SHORT TERM GOAL #2   Title Patient will demonstrate lumbar AROM without pain or limitation in order to improve ability to perform household tasks    Baseline Pt reports no limitation with lumbar motion    Time 3    Period Weeks    Status Achieved    Target Date 08/18/21      PT SHORT TERM GOAL #3   Title Patient will be able to sit >/= 30 minutes without increase in pain to improve ability to work    Baseline Pt reports no pain 08-16-21   TPDN beneficial    Time 3    Period Weeks    Status Achieved    Target Date 08/18/21               PT Long  Term Goals - 09/01/21 0840       PT LONG TERM GOAL #1   Title Patient will be I with final HEP to maintain progress from PT    Baseline patient demonstrates independence with HEP    Time 6    Period Weeks    Status Achieved      PT LONG TERM GOAL #2   Title Patient will exhibit core and hip strength grossly >/= 4/5 MMT in order to improve lifting ability    Baseline core and hip strength grossly 4/5 MMT    Time 6    Period Weeks    Status Achieved      PT LONG TERM GOAL #3   Title Patient will report no limitation or increased pain with sitting or walking >/= 1 hour to improve work and community access    Baseline patient reports no limitation    Time 6    Period Weeks    Status Achieved                   Plan - 09/01/21 0840     Clinical Impression Statement Patient tolerated therapy well with no adverse effects. She has achieved all established goals and is progressing very well with her strengthening exercises. She is independent with her current HEP and reports ability to progress herself at the gym. Patient will be discharged from PT at this time.    PT Treatment/Interventions ADLs/Self Care Home Management;Aquatic Therapy;Cryotherapy;Electrical Stimulation;Iontophoresis 33m/ml Dexamethasone;Moist Heat;Traction;Ultrasound;Neuromuscular re-education;Balance training;Therapeutic exercise;Therapeutic activities;Functional mobility training;Stair training;Gait training;Patient/family education;Manual techniques;Dry needling;Passive range of motion;Taping;Vasopneumatic Device;Spinal Manipulations;Joint Manipulations    PT Next Visit Plan NA - discharge    PT Home Exercise Plan [email or web address]   Consulted and Agree with Plan of Care Patient             Patient will benefit from skilled therapeutic intervention in order to improve the following deficits and impairments:  Decreased range of motion, Pain, Increased muscle spasms, Decreased activity tolerance, Postural  dysfunction, Decreased strength  Visit Diagnosis: Chronic bilateral low back pain, unspecified whether sciatica present  Muscle weakness (generalized)     Problem List Patient Active Problem List   Diagnosis Date Noted   Sickle cell trait ([place]) 08/16/2019   Indication for care in labor or delivery 08/12/2019    Hilda Blades, PT, DPT, LAT, ATC 09/01/21  9:15 AM Phone: [phone] Fax: [facility] [address] [place], [place], [address] Phone: [phone]   Fax:  [phone]  Name: Kelly Lyons MRN: [id number] Date of Birth: Mar 22, 1996   PHYSICAL THERAPY DISCHARGE SUMMARY  Visits from Start of Care: 7  Current functional level related to goals / functional outcomes: See above   Remaining deficits: See above   Education / Equipment: HEP   Patient agrees to discharge. Patient goals were met. Patient is being discharged due to meeting the stated rehab goals.

## 2022-01-16 ENCOUNTER — Encounter (HOSPITAL_COMMUNITY): Payer: Self-pay | Admitting: Emergency Medicine

## 2022-01-16 ENCOUNTER — Ambulatory Visit (HOSPITAL_COMMUNITY)
Admission: EM | Admit: 2022-01-16 | Discharge: 2022-01-16 | Disposition: A | Discharge status: Home / Self Care | Payer: Medicaid Other | Attending: Internal Medicine | Admitting: Internal Medicine

## 2022-01-16 DIAGNOSIS — Z202 Contact with and (suspected) exposure to infections with a predominantly sexual mode of transmission: Secondary | ICD-10-CM | POA: Diagnosis present

## 2022-01-16 LAB — POCT URINALYSIS DIPSTICK, ED / UC
Glucose, UA: NEGATIVE mg/dL
Hgb urine dipstick: NEGATIVE
Ketones, ur: NEGATIVE mg/dL
Nitrite: NEGATIVE
Protein, ur: NEGATIVE mg/dL
Specific Gravity, Urine: >=1.03 (ref 1.005–1.030)
Urobilinogen, UA: 1 mg/dL (ref 0.0–1.0)
pH: 6 (ref 5.0–8.0)

## 2022-01-16 LAB — POC URINE PREG, ED: Preg Test, Ur: NEGATIVE

## 2022-01-16 NOTE — ED Triage Notes (Signed)
Pt reports that her boyfriend was positive for gonorrhea and chlamydia and unsure if she has something and wants testing. ?Pt reports that her cycle was 3-9-3/12. Then started having vaginal bleeding again.  ?

## 2022-01-16 NOTE — Discharge Instructions (Signed)
We will call you with recommendations if labs are abnormal ?Safe sex practices advised ?If vaginal bleeding persist, please follow-up with primary care physician or OB/GYN ?Your pregnancy test is negative. ?

## 2022-01-17 ENCOUNTER — Telehealth (HOSPITAL_COMMUNITY): Payer: Self-pay | Admitting: Emergency Medicine

## 2022-01-17 ENCOUNTER — Ambulatory Visit (HOSPITAL_COMMUNITY)
Admission: EM | Admit: 2022-01-17 | Discharge: 2022-01-17 | Disposition: A | Discharge status: Home / Self Care | Payer: Medicaid Other | Attending: Family Medicine | Admitting: Family Medicine

## 2022-01-17 ENCOUNTER — Encounter (HOSPITAL_COMMUNITY): Payer: Self-pay

## 2022-01-17 DIAGNOSIS — Z202 Contact with and (suspected) exposure to infections with a predominantly sexual mode of transmission: Secondary | ICD-10-CM

## 2022-01-17 LAB — CERVICOVAGINAL ANCILLARY ONLY
Chlamydia: POSITIVE — AB
Comment: NEGATIVE
Comment: NEGATIVE
Comment: NORMAL
Neisseria Gonorrhea: POSITIVE — AB
Trichomonas: POSITIVE — AB

## 2022-01-17 MED ORDER — CEFTRIAXONE SODIUM 500 MG IJ SOLR
INTRAMUSCULAR | Status: AC
  Filled 2022-01-17: qty 500

## 2022-01-17 MED ORDER — LIDOCAINE HCL (PF) 1 % IJ SOLN
INTRAMUSCULAR | Status: AC
  Filled 2022-01-17: qty 2

## 2022-01-17 MED ORDER — DOXYCYCLINE HYCLATE 100 MG PO CAPS: 100.0000 mg | ORAL_CAPSULE | Freq: Two times a day (BID) | ORAL | 0 refills | Status: AC

## 2022-01-17 MED ORDER — CEFTRIAXONE SODIUM 500 MG IJ SOLR
500.0000 mg | Freq: Once | INTRAMUSCULAR | Status: AC
  Administered 2022-01-17: 500 mg via INTRAMUSCULAR

## 2022-01-17 NOTE — ED Provider Notes (Addendum)
?[facility] ? ? ? ?CSN: [id number] ?Arrival date & time: 01/17/22  7121 ? ? ?  ? ?History   ?Chief Complaint ?Chief Complaint  ?Patient presents with  ? Exposure to STD  ? ? ?HPI ?Kelly Lyons is a 26 y.o. female.  ? ? ?Exposure to STD ? ?For probable exposure to STDs.  She came in yesterday to get tested for STDs, since her boyfriend had just tested positive for gonorrhea and chlamydia.  At that time, she wanted to wait for results to see if she needed treatment for both or either.  Since her results were not back from the self swab yet today, she wants to go ahead and get treated empirically, since she will be able to get back until April 2 due to her work schedule.  No fevers or vomiting ? ?She is on her period right now; UA was benign yesterday and UPT was negative ? ?Past Medical History:  ?Diagnosis Date  ? Eczema   ? on face  ? ? ?Patient Active Problem List  ? Diagnosis Date Noted  ? Sickle cell trait (HCC) 08/16/2019  ? Indication for care in labor or delivery 08/12/2019  ? ? ?Past Surgical History:  ?Procedure Laterality Date  ? SHOULDER ARTHROSCOPY  December  ? For severely dislocated shoulder  ? ? ?OB History   ? ? Gravida  ?1  ? Para  ?1  ? Term  ?   ? Preterm  ?1  ? AB  ?   ? Living  ?1  ?  ? ? SAB  ?   ? IAB  ?   ? Ectopic  ?   ? Multiple  ?0  ? Live Births  ?1  ?   ?  ?  ? ? ? ?Home Medications   ? ?Prior to Admission medications   ?Medication Sig Start Date End Date Taking? Authorizing Provider  ?doxycycline (VIBRAMYCIN) 100 MG capsule Take 1 capsule (100 mg total) by mouth 2 (two) times daily for 7 days. 01/17/22 01/24/22 Yes Zenia Resides, MD  ?acetaminophen (TYLENOL) 325 MG tablet Take 2 tablets (650 mg total) by mouth every 4 (four) hours as needed (for pain scale < 4). 08/14/19   Derrel Nip, MD  ?ibuprofen (ADVIL) 600 MG tablet Take 1 tablet (600 mg total) by mouth every 6 (six) hours. 08/14/19   Derrel Nip, MD  ? ? ?Family History ?History reviewed. No pertinent  family history. ? ?Social History ?Social History  ? ?Tobacco Use  ? Smoking status: Never  ? Smokeless tobacco: Never  ?Vaping Use  ? Vaping Use: Never used  ?Substance Use Topics  ? Alcohol use: No  ? Drug use: Yes  ?  Types: Marijuana  ? ? ? ?Allergies   ?Patient has no known allergies. ? ? ?Review of Systems ?Review of Systems ? ? ?Physical Exam ?Triage Vital Signs ?ED Triage Vitals  ?Enc Vitals Group  ?   BP 01/17/22 1004 108/73  ?   Pulse Rate 01/17/22 1004 73  ?   Resp 01/17/22 1002 18  ?   Temp 01/17/22 1004 99.4 ?F (37.4 ?C)  ?   Temp Source 01/17/22 1004 Oral  ?   SpO2 01/17/22 1004 97 %  ?   Weight --   ?   Height --   ?   Head Circumference --   ?   Peak Flow --   ?   Pain Score 01/17/22 1000 0  ?  Pain Loc --   ?   Pain Edu? --   ?   Excl. in GC? --   ? ?No data found. ? ?Updated Vital Signs ?BP 108/73 (BP Location: Right Arm)   Pulse 73   Temp 99.4 ?F (37.4 ?C) (Oral)   Resp 18   LMP 12/29/2021   SpO2 97%  ? ?Visual Acuity ?Right Eye Distance:   ?Left Eye Distance:   ?Bilateral Distance:   ? ?Right Eye Near:   ?Left Eye Near:    ?Bilateral Near:    ? ?Physical Exam ?Vitals reviewed.  ?Constitutional:   ?   General: She is not in acute distress. ?   Appearance: She is not toxic-appearing.  ?HENT:  ?   Mouth/Throat:  ?   Mouth: Mucous membranes are moist.  ?Cardiovascular:  ?   Rate and Rhythm: Normal rate and regular rhythm.  ?Pulmonary:  ?   Effort: Pulmonary effort is normal.  ?   Breath sounds: Normal breath sounds.  ?Neurological:  ?   Mental Status: She is alert and oriented to person, place, and time.  ?Psychiatric:     ?   Behavior: Behavior normal.  ? ? ? ?UC Treatments / Results  ?Labs ?(all labs ordered are listed, but only abnormal results are displayed) ?Labs Reviewed - No data to display ? ?EKG ? ? ?Radiology ?No results found. ? ?Procedures ?Procedures (including critical care time) ? ?Medications Ordered in UC ?Medications  ?cefTRIAXone (ROCEPHIN) injection 500 mg (has no  administration in time range)  ? ? ?Initial Impression / Assessment and Plan / UC Course  ?I have reviewed the triage vital signs and the nursing notes. ? ?Pertinent labs & imaging results that were available during my care of the patient were reviewed by me and considered in my medical decision making (see chart for details). ? ?  ? ?We will treat empirically with ceftriaxone and doxycycline.  She will look on my chart for her results.  Staff will still call her if she needs additional treatment for trichomonas, bacterial vaginosis, or yeast ?Final Clinical Impressions(s) / UC Diagnoses  ? ?Final diagnoses:  ?STD exposure  ? ? ? ?Discharge Instructions   ? ?  ?You have been given a shot of ceftriaxone 500 mg today ? ?Take doxycycline 100 mg--1 capsule twice daily for 7 days. ? ?Rest from intercourse for 1 to 2 weeks after treatment. ? ? ? ? ?ED Prescriptions   ? ? Medication Sig Dispense Auth. Provider  ? doxycycline (VIBRAMYCIN) 100 MG capsule Take 1 capsule (100 mg total) by mouth 2 (two) times daily for 7 days. 14 capsule Zenia Resides, MD  ? ?  ? ?PDMP not reviewed this encounter. ?  ?Zenia Resides, MD ?01/17/22 1019 ? ?  ?Zenia Resides, MD ?01/17/22 1019 ? ?

## 2022-01-17 NOTE — Discharge Instructions (Addendum)
You have been given a shot of ceftriaxone 500 mg today ? ?Take doxycycline 100 mg--1 capsule twice daily for 7 days. ? ?Rest from intercourse for 1 to 2 weeks after treatment. ?

## 2022-01-17 NOTE — Telephone Encounter (Signed)
Per protocol, patient will need treatment with IM Rocephin 500mg  for positive gonorrhea.  Will also need treatment with Doxycycline and Metronidazole.   ?Attempted to reach patient x 1, LVM ?Recommend blood work ?[facility] notified ?Prescription sent to pharmacy on file ?

## 2022-01-17 NOTE — ED Triage Notes (Signed)
Pt stated her boyfriend tested positive for chlamydia and gonorrhea. Pt was tested yesterday, and wants to start medication depending on her results from yesterday. Pt states vaginal bleeding.  ?

## 2022-01-17 NOTE — ED Provider Notes (Signed)
?[place] ? ? ? ?CSN: [id number] ?Arrival date & time: 01/16/22  1357 ? ? ?  ? ?History   ?Chief Complaint ?Chief Complaint  ?Patient presents with  ? Exposure to STD  ? Vaginal Bleeding  ? ? ?HPI ?Kelly Lyons is a 26 y.o. female comes to the urgent care requesting STD screening.  Patient denies any vaginal discharge, dysuria urgency or frequency.  No abdominal pain.  Patient's sexual partner, who is new, tested positive for gonorrhea and chlamydia.  Patient's partner was treated for gonorrhea.  Patient is currently menstruating.  Patient denies deep dyspareunia. ? ?HPI ? ?Past Medical History:  ?Diagnosis Date  ? Eczema   ? on face  ? ? ?Patient Active Problem List  ? Diagnosis Date Noted  ? Sickle cell trait ([place]) 08/16/2019  ? Indication for care in labor or delivery 08/12/2019  ? ? ?Past Surgical History:  ?Procedure Laterality Date  ? SHOULDER ARTHROSCOPY  December  ? For severely dislocated shoulder  ? ? ?OB History   ? ? Gravida  ?1  ? Para  ?1  ? Term  ?   ? Preterm  ?1  ? AB  ?   ? Living  ?1  ?  ? ? SAB  ?   ? IAB  ?   ? Ectopic  ?   ? Multiple  ?0  ? Live Births  ?1  ?   ?  ?  ? ? ? ?Home Medications   ? ?Prior to Admission medications   ?Medication Sig Start Date End Date Taking? Authorizing Provider  ?acetaminophen (TYLENOL) 325 MG tablet Take 2 tablets (650 mg total) by mouth every 4 (four) hours as needed (for pain scale < 4). 08/14/19   Gifford Shave, MD  ?doxycycline (VIBRAMYCIN) 100 MG capsule Take 1 capsule (100 mg total) by mouth 2 (two) times daily for 7 days. 01/17/22 01/24/22  Barrett Henle, MD  ?ibuprofen (ADVIL) 600 MG tablet Take 1 tablet (600 mg total) by mouth every 6 (six) hours. 08/14/19   Gifford Shave, MD  ? ? ?Family History ?No family history on file. ? ?Social History ?Social History  ? ?Tobacco Use  ? Smoking status: Never  ? Smokeless tobacco: Never  ?Vaping Use  ? Vaping Use: Never used  ?Substance Use Topics  ? Alcohol use: No  ? Drug use: Yes  ?   Types: Marijuana  ? ? ? ?Allergies   ?Patient has no known allergies. ? ? ?Review of Systems ?Review of Systems  ?Genitourinary:  Positive for vaginal bleeding. Negative for decreased urine volume, dysuria, frequency, urgency, vaginal discharge and vaginal pain.  ? ? ?Physical Exam ?Triage Vital Signs ?ED Triage Vitals  ?Enc Vitals Group  ?   BP 01/16/22 1458 112/73  ?   Pulse Rate 01/16/22 1458 75  ?   Resp 01/16/22 1458 16  ?   Temp 01/16/22 1458 98.4 ?F (36.9 ?C)  ?   Temp Source 01/16/22 1458 Oral  ?   SpO2 01/16/22 1458 100 %  ?   Weight --   ?   Height --   ?   Head Circumference --   ?   Peak Flow --   ?   Pain Score 01/16/22 1529 0  ?   Pain Loc --   ?   Pain Edu? --   ?   Excl. in [place]? --   ? ?No data found. ? ?Updated Vital Signs ?BP 112/73 (BP Location:  Left Arm)   Pulse 75   Temp 98.4 ?F (36.9 ?C) (Oral)   Resp 16   LMP 12/29/2021   SpO2 100%  ? ?Visual Acuity ?Right Eye Distance:   ?Left Eye Distance:   ?Bilateral Distance:   ? ?Right Eye Near:   ?Left Eye Near:    ?Bilateral Near:    ? ?Physical Exam ?Vitals and nursing note reviewed.  ?Cardiovascular:  ?   Rate and Rhythm: Normal rate and regular rhythm.  ?Pulmonary:  ?   Effort: Pulmonary effort is normal.  ?   Breath sounds: Normal breath sounds.  ?Abdominal:  ?   General: Bowel sounds are normal.  ?   Palpations: Abdomen is soft.  ? ? ? ?UC Treatments / Results  ?Labs ?(all labs ordered are listed, but only abnormal results are displayed) ?Labs Reviewed  ?POCT URINALYSIS DIPSTICK, ED / UC - Abnormal; Notable for the following components:  ?    Result Value  ? Bilirubin Urine SMALL (*)   ? Leukocytes,Ua TRACE (*)   ? All other components within normal limits  ?CERVICOVAGINAL ANCILLARY ONLY - Abnormal; Notable for the following components:  ? Neisseria Gonorrhea Positive (*)   ? Chlamydia Positive (*)   ? Trichomonas Positive (*)   ? All other components within normal limits  ?POC URINE PREG, ED  ? ? ?EKG ? ? ?Radiology ?No results  found. ? ?Procedures ?Procedures (including critical care time) ? ?Medications Ordered in UC ?Medications - No data to display ? ?Initial Impression / Assessment and Plan / UC Course  ?I have reviewed the triage vital signs and the nursing notes. ? ?Pertinent labs & imaging results that were available during my care of the patient were reviewed by me and considered in my medical decision making (see chart for details). ? ?  ? ?1.  Exposure to STD: ?Cervical vaginal swab for GC/chlamydia/trichomonas ?Point-of-care urinalysis is negative for UTI ?Urine pregnancy test is negative ?We will call patient with recommendations if labs are abnormal. ?Safe sex practices recommended. ?Final Clinical Impressions(s) / UC Diagnoses  ? ?Final diagnoses:  ?Exposure to STD  ? ? ? ?Discharge Instructions   ? ?  ?We will call you with recommendations if labs are abnormal ?Safe sex practices advised ?If vaginal bleeding persist, please follow-up with primary care physician or OB/GYN ?Your pregnancy test is negative. ? ? ?ED Prescriptions   ?None ?  ? ?PDMP not reviewed this encounter. ?  ?Chase Picket, MD ?01/17/22 1647 ? ?

## 2022-01-19 ENCOUNTER — Telehealth (HOSPITAL_COMMUNITY): Payer: Self-pay | Admitting: Emergency Medicine

## 2022-01-19 MED ORDER — METRONIDAZOLE 500 MG PO TABS: 500.0000 mg | ORAL_TABLET | Freq: Two times a day (BID) | ORAL | 0 refills | Status: DC

## 2022-03-10 ENCOUNTER — Ambulatory Visit
Admission: EM | Admit: 2022-03-10 | Discharge: 2022-03-10 | Disposition: A | Discharge status: Home / Self Care | Payer: Medicaid Other | Attending: Internal Medicine | Admitting: Internal Medicine

## 2022-03-10 DIAGNOSIS — Z202 Contact with and (suspected) exposure to infections with a predominantly sexual mode of transmission: Secondary | ICD-10-CM | POA: Diagnosis not present

## 2022-03-10 DIAGNOSIS — Z3202 Encounter for pregnancy test, result negative: Secondary | ICD-10-CM | POA: Diagnosis not present

## 2022-03-10 DIAGNOSIS — Z113 Encounter for screening for infections with a predominantly sexual mode of transmission: Secondary | ICD-10-CM

## 2022-03-10 LAB — POCT URINE PREGNANCY: Preg Test, Ur: NEGATIVE

## 2022-03-10 MED ORDER — DOXYCYCLINE HYCLATE 100 MG PO CAPS: 100.0000 mg | ORAL_CAPSULE | Freq: Two times a day (BID) | ORAL | 0 refills | Status: DC

## 2022-03-10 MED ORDER — CEFTRIAXONE SODIUM 500 MG IJ SOLR
500.0000 mg | Freq: Once | INTRAMUSCULAR | Status: AC
  Administered 2022-03-10: 500 mg via INTRAMUSCULAR

## 2022-03-10 MED ORDER — METRONIDAZOLE 500 MG PO TABS: 500.0000 mg | ORAL_TABLET | Freq: Two times a day (BID) | ORAL | 0 refills | Status: DC

## 2022-03-10 NOTE — ED Triage Notes (Signed)
Pt c/o sti exposure to gonorrhea, chlamydia, and trich. Notified by sex partner last week. States only sx is pelvic pain.   Denies dysuria, frequency, hematuria, back pain, vaginal odor or discharge, skin lesions.   Also requests pregnancy test.

## 2022-03-10 NOTE — Discharge Instructions (Signed)
You are being treated for gonorrhea, chlamydia, trichomonas.  An antibiotic and 2 prescriptions for other medications were sent to treat chlamydia and trichomonas.  Your swabs and blood work are pending.  We will call if there are any abnormalities.  Please refrain from sexual activity at least 7 days following treatment.  Follow-up if symptoms persist or worsen.

## 2022-03-10 NOTE — ED Provider Notes (Signed)
[facility]    CSN: [id number] Arrival date & time: 03/10/22  0908      History   Chief Complaint Chief Complaint  Patient presents with   Body Fluid Exposure    HPI Kelly Lyons is a 26 y.o. female.   Patient present with exposure to gonorrhea, chlamydia, trichomonas from her recent sexual partner.  She had unprotected sexual intercourse approximately 2 weeks ago.  Denies any associated symptoms other than lower abdominal cramping that is intermittent that started yesterday.  Denies vaginal discharge, dysuria, urinary frequency, back pain, fever.  Patient would also like pregnancy testing as it has been over a month since she had her last menstrual cycle.  She reports that she typically has normal menstrual cycles.  She has Mirena in place.  Also requesting blood work for HIV and syphilis.   Body Fluid Exposure  Past Medical History:  Diagnosis Date   Eczema    on face    Patient Active Problem List   Diagnosis Date Noted   Sickle cell trait (HCC) 08/16/2019   Indication for care in labor or delivery 08/12/2019    Past Surgical History:  Procedure Laterality Date   SHOULDER ARTHROSCOPY  December   For severely dislocated shoulder    OB History     Gravida  1   Para  1   Term      Preterm  1   AB      Living  1      SAB      IAB      Ectopic      Multiple  0   Live Births  1            Home Medications    Prior to Admission medications   Medication Sig Start Date End Date Taking? Authorizing Provider  doxycycline (VIBRAMYCIN) 100 MG capsule Take 1 capsule (100 mg total) by mouth 2 (two) times daily. 03/10/22  Yes Arpan Eskelson, Acie FredricksonHaley E, FNP  metroNIDAZOLE (FLAGYL) 500 MG tablet Take 1 tablet (500 mg total) by mouth 2 (two) times daily. 03/10/22  Yes Quenton Recendez, Acie FredricksonHaley E, FNP  acetaminophen (TYLENOL) 325 MG tablet Take 2 tablets (650 mg total) by mouth every 4 (four) hours as needed (for pain scale < 4). 08/14/19   Derrel Nipresenzo, Victor, MD   ibuprofen (ADVIL) 600 MG tablet Take 1 tablet (600 mg total) by mouth every 6 (six) hours. 08/14/19   Derrel Nipresenzo, Victor, MD    Family History History reviewed. No pertinent family history.  Social History Social History   Tobacco Use   Smoking status: Never   Smokeless tobacco: Never  Vaping Use   Vaping Use: Never used  Substance Use Topics   Alcohol use: No   Drug use: Yes    Types: Marijuana     Allergies   Patient has no known allergies.   Review of Systems Review of Systems Per HPI  Physical Exam Triage Vital Signs ED Triage Vitals  Enc Vitals Group     BP 03/10/22 0929 111/64     Pulse Rate 03/10/22 0929 73     Resp 03/10/22 0929 18     Temp 03/10/22 0929 98.6 F (37 C)     Temp Source 03/10/22 0929 Oral     SpO2 03/10/22 0929 98 %     Weight --      Height --      Head Circumference --      Peak Flow --  Pain Score 03/10/22 0930 0     Pain Loc --      Pain Edu? --      Excl. in GC? --    No data found.  Updated Vital Signs BP 111/64 (BP Location: Left Arm)   Pulse 73   Temp 98.6 F (37 C) (Oral)   Resp 18   SpO2 98%   Visual Acuity Right Eye Distance:   Left Eye Distance:   Bilateral Distance:    Right Eye Near:   Left Eye Near:    Bilateral Near:     Physical Exam Constitutional:      General: She is not in acute distress.    Appearance: Normal appearance. She is not toxic-appearing or diaphoretic.  HENT:     Head: Normocephalic and atraumatic.  Eyes:     Extraocular Movements: Extraocular movements intact.     Conjunctiva/sclera: Conjunctivae normal.  Cardiovascular:     Rate and Rhythm: Normal rate and regular rhythm.     Pulses: Normal pulses.     Heart sounds: Normal heart sounds.  Pulmonary:     Effort: Pulmonary effort is normal. No respiratory distress.     Breath sounds: Normal breath sounds.  Abdominal:     General: Abdomen is flat. Bowel sounds are normal. There is no distension.     Palpations: Abdomen is  soft.     Tenderness: There is no abdominal tenderness.  Genitourinary:    Comments: Deferred with shared decision making. Self swab performed.  Neurological:     General: No focal deficit present.     Mental Status: She is alert and oriented to person, place, and time. Mental status is at baseline.  Psychiatric:        Mood and Affect: Mood normal.        Behavior: Behavior normal.        Thought Content: Thought content normal.        Judgment: Judgment normal.     UC Treatments / Results  Labs (all labs ordered are listed, but only abnormal results are displayed) Labs Reviewed  RPR  HIV ANTIBODY (ROUTINE TESTING W REFLEX)  POCT URINE PREGNANCY  CERVICOVAGINAL ANCILLARY ONLY  CYTOLOGY, (ORAL, ANAL, URETHRAL) ANCILLARY ONLY    EKG   Radiology No results found.  Procedures Procedures (including critical care time)  Medications Ordered in UC Medications  cefTRIAXone (ROCEPHIN) injection 500 mg (has no administration in time range)    Initial Impression / Assessment and Plan / UC Course  I have reviewed the triage vital signs and the nursing notes.  Pertinent labs & imaging results that were available during my care of the patient were reviewed by me and considered in my medical decision making (see chart for details).     Urine pregnancy test was negative.  Will prophylactically treated for exposure to trichomonas, gonorrhea, chlamydia with doxycycline, IM Rocephin, metronidazole.  No concern for PID at this time.  Patient to follow-up with gynecology due to missed menstrual cycle.  HIV and RPR pending per patient request. Patient to refrain from sexual activity until test results and treatment are complete.  Patient requesting cytology swab of the throat as she has performed oral sex with same sexual partner as well.  Discussed return precautions.  Patient verbalized understanding and was agreeable with plan. Final Clinical Impressions(s) / UC Diagnoses   Final  diagnoses:  STD exposure  Screening examination for venereal disease  Urine pregnancy test negative  Discharge Instructions      You are being treated for gonorrhea, chlamydia, trichomonas.  An antibiotic and 2 prescriptions for other medications were sent to treat chlamydia and trichomonas.  Your swabs and blood work are pending.  We will call if there are any abnormalities.  Please refrain from sexual activity at least 7 days following treatment.  Follow-up if symptoms persist or worsen.     ED Prescriptions     Medication Sig Dispense Auth. Provider   metroNIDAZOLE (FLAGYL) 500 MG tablet Take 1 tablet (500 mg total) by mouth 2 (two) times daily. 14 tablet Warren, [place] E, [place]   doxycycline (VIBRAMYCIN) 100 MG capsule Take 1 capsule (100 mg total) by mouth 2 (two) times daily. 20 capsule Gustavus Bryant, [place]      PDMP not reviewed this encounter.   Gustavus Bryant, [place] 03/10/22 1009

## 2022-03-11 LAB — SYPHILIS: RPR W/REFLEX TO RPR TITER AND TREPONEMAL ANTIBODIES, TRADITIONAL SCREENING AND DIAGNOSIS ALGORITHM: RPR Ser Ql: NONREACTIVE

## 2022-03-11 LAB — HIV ANTIBODY (ROUTINE TESTING W REFLEX): HIV Screen 4th Generation wRfx: NONREACTIVE

## 2022-03-13 LAB — CYTOLOGY, (ORAL, ANAL, URETHRAL) ANCILLARY ONLY
Chlamydia: NEGATIVE
Comment: NEGATIVE
Comment: NEGATIVE
Comment: NORMAL
Neisseria Gonorrhea: POSITIVE — AB
Trichomonas: NEGATIVE

## 2022-03-13 LAB — CERVICOVAGINAL ANCILLARY ONLY
Bacterial Vaginitis (gardnerella): POSITIVE — AB
Candida Glabrata: NEGATIVE
Candida Vaginitis: NEGATIVE
Chlamydia: NEGATIVE
Comment: NEGATIVE
Comment: NEGATIVE
Comment: NEGATIVE
Comment: NEGATIVE
Comment: NEGATIVE
Comment: NORMAL
Neisseria Gonorrhea: NEGATIVE
Trichomonas: NEGATIVE

## 2022-03-14 ENCOUNTER — Telehealth (HOSPITAL_COMMUNITY): Payer: Self-pay | Admitting: Emergency Medicine

## 2022-03-14 NOTE — Telephone Encounter (Signed)
Opened in error, all meds sent at visit

## 2022-03-17 ENCOUNTER — Encounter: Payer: Self-pay | Admitting: Emergency Medicine

## 2022-03-17 ENCOUNTER — Ambulatory Visit
Admission: EM | Admit: 2022-03-17 | Discharge: 2022-03-17 | Disposition: A | Discharge status: Home / Self Care | Payer: Medicaid Other | Attending: Physician Assistant | Admitting: Physician Assistant

## 2022-03-17 DIAGNOSIS — Z202 Contact with and (suspected) exposure to infections with a predominantly sexual mode of transmission: Secondary | ICD-10-CM | POA: Diagnosis present

## 2022-03-17 NOTE — ED Provider Notes (Signed)
[facility]    CSN: [id number] Arrival date & time: 03/17/22  0858      History   Chief Complaint Chief Complaint  Patient presents with   Follow-up    HPI Kelly Lyons is a 26 y.o. female.   Pt request repeat testing for GC and Ct.  Pt had a positive oral gc test.  Pt has received Rocephin.  Pt denies any symptoms   The history is provided by the patient. No language interpreter was used.   Past Medical History:  Diagnosis Date   Eczema    on face    Patient Active Problem List   Diagnosis Date Noted   Sickle cell trait (HCC) 08/16/2019   Indication for care in labor or delivery 08/12/2019    Past Surgical History:  Procedure Laterality Date   SHOULDER ARTHROSCOPY  December   For severely dislocated shoulder    OB History     Gravida  1   Para  1   Term      Preterm  1   AB      Living  1      SAB      IAB      Ectopic      Multiple  0   Live Births  1            Home Medications    Prior to Admission medications   Medication Sig Start Date End Date Taking? Authorizing Provider  acetaminophen (TYLENOL) 325 MG tablet Take 2 tablets (650 mg total) by mouth every 4 (four) hours as needed (for pain scale < 4). 08/14/19   Derrel Nip, MD  doxycycline (VIBRAMYCIN) 100 MG capsule Take 1 capsule (100 mg total) by mouth 2 (two) times daily. 03/10/22   Gustavus Bryant, FNP  ibuprofen (ADVIL) 600 MG tablet Take 1 tablet (600 mg total) by mouth every 6 (six) hours. 08/14/19   Derrel Nip, MD  metroNIDAZOLE (FLAGYL) 500 MG tablet Take 1 tablet (500 mg total) by mouth 2 (two) times daily. 03/10/22   Gustavus Bryant, FNP    Family History History reviewed. No pertinent family history.  Social History Social History   Tobacco Use   Smoking status: Never   Smokeless tobacco: Never  Vaping Use   Vaping Use: Never used  Substance Use Topics   Alcohol use: No   Drug use: Yes    Types: Marijuana     Allergies    Patient has no known allergies.   Review of Systems Review of Systems  All other systems reviewed and are negative.   Physical Exam Triage Vital Signs ED Triage Vitals  Enc Vitals Group     BP 03/17/22 1030 99/65     Pulse Rate 03/17/22 1030 (!) 59     Resp 03/17/22 1030 16     Temp 03/17/22 1030 98.6 F (37 C)     Temp Source 03/17/22 1030 Oral     SpO2 03/17/22 1030 98 %     Weight --      Height --      Head Circumference --      Peak Flow --      Pain Score 03/17/22 1032 0     Pain Loc --      Pain Edu? --      Excl. in [place]? --    No data found.  Updated Vital Signs BP 99/65 (BP Location: Left Arm)   Pulse Marland Kitchen)  59   Temp 98.6 F (37 C) (Oral)   Resp 16   LMP 03/11/2022 (Exact Date)   SpO2 98%   Visual Acuity Right Eye Distance:   Left Eye Distance:   Bilateral Distance:    Right Eye Near:   Left Eye Near:    Bilateral Near:     Physical Exam Vitals and nursing note reviewed.  Constitutional:      Appearance: She is well-developed.  HENT:     Head: Normocephalic.     Mouth/Throat:     Mouth: Mucous membranes are moist.     Pharynx: Oropharynx is clear.  Eyes:     Pupils: Pupils are equal, round, and reactive to light.  Pulmonary:     Effort: Pulmonary effort is normal.  Abdominal:     General: There is no distension.  Musculoskeletal:        General: Normal range of motion.     Cervical back: Normal range of motion.  Neurological:     Mental Status: She is alert and oriented to person, place, and time.     UC Treatments / Results  Labs (all labs ordered are listed, but only abnormal results are displayed) Labs Reviewed  CERVICOVAGINAL ANCILLARY ONLY  CYTOLOGY, (ORAL, ANAL, URETHRAL) ANCILLARY ONLY    EKG   Radiology No results found.  Procedures Procedures (including critical care time)  Medications Ordered in UC Medications - No data to display  Initial Impression / Assessment and Plan / UC Course  I have reviewed the  triage vital signs and the nursing notes.  Pertinent labs & imaging results that were available during my care of the patient were reviewed by me and considered in my medical decision making (see chart for details).      Final Clinical Impressions(s) / UC Diagnoses   Final diagnoses:  Possible exposure to STD   Discharge Instructions   None    ED Prescriptions   None    PDMP not reviewed this encounter.   Elson Areas, [place] 03/17/22 1056

## 2022-03-17 NOTE — ED Triage Notes (Signed)
Patient presents for follow-up STI testing.   Patient reports having gonorrhea in throat and being previously treated at this clinic.   Patient wants retesting for this issues. Patient received Rocephin injection 7 days ago at last visit.   Patient wants vaginal and oral STI testing.

## 2022-03-21 LAB — CERVICOVAGINAL ANCILLARY ONLY
Bacterial Vaginitis (gardnerella): POSITIVE — AB
Candida Glabrata: NEGATIVE
Candida Vaginitis: POSITIVE — AB
Chlamydia: NEGATIVE
Comment: NEGATIVE
Comment: NEGATIVE
Comment: NEGATIVE
Comment: NEGATIVE
Comment: NEGATIVE
Comment: NORMAL
Neisseria Gonorrhea: NEGATIVE
Trichomonas: NEGATIVE

## 2022-03-21 LAB — CYTOLOGY, (ORAL, ANAL, URETHRAL) ANCILLARY ONLY
Chlamydia: NEGATIVE
Comment: NEGATIVE
Comment: NORMAL
Neisseria Gonorrhea: NEGATIVE

## 2022-03-22 ENCOUNTER — Telehealth (HOSPITAL_COMMUNITY): Payer: Self-pay | Admitting: Emergency Medicine

## 2022-03-22 MED ORDER — FLUCONAZOLE 150 MG PO TABS: 150.0000 mg | ORAL_TABLET | Freq: Once | ORAL | 0 refills | Status: AC

## 2022-03-22 MED ORDER — METRONIDAZOLE 500 MG PO TABS: 500.0000 mg | ORAL_TABLET | Freq: Two times a day (BID) | ORAL | 0 refills | Status: DC

## 2023-08-13 DIAGNOSIS — I2699 Other pulmonary embolism without acute cor pulmonale: Secondary | ICD-10-CM | POA: Insufficient documentation

## 2023-08-16 DIAGNOSIS — Z975 Presence of (intrauterine) contraceptive device: Secondary | ICD-10-CM | POA: Insufficient documentation

## 2023-08-30 ENCOUNTER — Emergency Department (HOSPITAL_COMMUNITY): Payer: Medicaid Other

## 2023-08-30 ENCOUNTER — Other Ambulatory Visit: Payer: Self-pay

## 2023-08-30 ENCOUNTER — Emergency Department (HOSPITAL_COMMUNITY)
Admission: EM | Admit: 2023-08-30 | Discharge: 2023-08-31 | Disposition: A | Discharge status: Home / Self Care | Payer: Medicaid Other | Attending: Emergency Medicine | Admitting: Emergency Medicine

## 2023-08-30 ENCOUNTER — Encounter (HOSPITAL_COMMUNITY): Payer: Self-pay | Admitting: Emergency Medicine

## 2023-08-30 DIAGNOSIS — Z7901 Long term (current) use of anticoagulants: Secondary | ICD-10-CM | POA: Insufficient documentation

## 2023-08-30 DIAGNOSIS — Z79899 Other long term (current) drug therapy: Secondary | ICD-10-CM | POA: Diagnosis not present

## 2023-08-30 DIAGNOSIS — Z20822 Contact with and (suspected) exposure to covid-19: Secondary | ICD-10-CM | POA: Insufficient documentation

## 2023-08-30 DIAGNOSIS — J189 Pneumonia, unspecified organism: Secondary | ICD-10-CM | POA: Insufficient documentation

## 2023-08-30 DIAGNOSIS — R051 Acute cough: Secondary | ICD-10-CM

## 2023-08-30 DIAGNOSIS — R0602 Shortness of breath: Secondary | ICD-10-CM | POA: Diagnosis present

## 2023-08-30 HISTORY — DX: Other pulmonary embolism without acute cor pulmonale: I26.99

## 2023-08-30 LAB — HCG, SERUM, QUALITATIVE: Preg, Serum: NEGATIVE

## 2023-08-30 LAB — CBC WITH DIFFERENTIAL/PLATELET
Abs Immature Granulocytes: 0.06 10*3/uL (ref 0.00–0.07)
Basophils Absolute: 0 10*3/uL (ref 0.0–0.1)
Basophils Relative: 0 %
Eosinophils Absolute: 0.2 10*3/uL (ref 0.0–0.5)
Eosinophils Relative: 1 %
HCT: 39.2 % (ref 36.0–46.0)
Hemoglobin: 14 g/dL (ref 12.0–15.0)
Immature Granulocytes: 0 %
Lymphocytes Relative: 13 %
Lymphs Abs: 1.9 10*3/uL (ref 0.7–4.0)
MCH: 29.9 pg (ref 26.0–34.0)
MCHC: 35.7 g/dL (ref 30.0–36.0)
MCV: 83.8 fL (ref 80.0–100.0)
Monocytes Absolute: 1 10*3/uL (ref 0.1–1.0)
Monocytes Relative: 6 %
Neutro Abs: 12.1 10*3/uL — ABNORMAL HIGH (ref 1.7–7.7)
Neutrophils Relative %: 80 %
Platelets: 507 10*3/uL — ABNORMAL HIGH (ref 150–400)
RBC: 4.68 MIL/uL (ref 3.87–5.11)
RDW: 12.9 % (ref 11.5–15.5)
WBC: 15.2 10*3/uL — ABNORMAL HIGH (ref 4.0–10.5)
nRBC: 0 % (ref 0.0–0.2)

## 2023-08-30 LAB — URINALYSIS, ROUTINE W REFLEX MICROSCOPIC
Bacteria, UA: NONE SEEN
Bilirubin Urine: NEGATIVE
Glucose, UA: NEGATIVE mg/dL
Ketones, ur: NEGATIVE mg/dL
Leukocytes,Ua: NEGATIVE
Nitrite: NEGATIVE
Protein, ur: NEGATIVE mg/dL
Specific Gravity, Urine: 1.02 (ref 1.005–1.030)
pH: 5 (ref 5.0–8.0)

## 2023-08-30 LAB — COMPREHENSIVE METABOLIC PANEL
ALT: 27 U/L (ref 0–44)
AST: 23 U/L (ref 15–41)
Albumin: 3.5 g/dL (ref 3.5–5.0)
Alkaline Phosphatase: 63 U/L (ref 38–126)
Anion gap: 10 (ref 5–15)
BUN: 10 mg/dL (ref 6–20)
CO2: 22 mmol/L (ref 22–32)
Calcium: 9.5 mg/dL (ref 8.9–10.3)
Chloride: 103 mmol/L (ref 98–111)
Creatinine, Ser: 0.75 mg/dL (ref 0.44–1.00)
GFR, Estimated: >60 mL/min (ref >60)
Glucose, Bld: 86 mg/dL (ref 70–99)
Potassium: 3.7 mmol/L (ref 3.5–5.1)
Sodium: 135 mmol/L (ref 135–145)
Total Bilirubin: 0.5 mg/dL (ref <1.2)
Total Protein: 7.6 g/dL (ref 6.5–8.1)

## 2023-08-30 LAB — TROPONIN I (HIGH SENSITIVITY): Troponin I (High Sensitivity): 3 ng/L (ref <18)

## 2023-08-30 LAB — PROTIME-INR
INR: 1.2 (ref 0.8–1.2)
Prothrombin Time: 15.4 s — ABNORMAL HIGH (ref 11.4–15.2)

## 2023-08-30 LAB — RAPID URINE DRUG SCREEN, HOSP PERFORMED
Amphetamines: NOT DETECTED
Barbiturates: NOT DETECTED
Benzodiazepines: NOT DETECTED
Cocaine: NOT DETECTED
Opiates: NOT DETECTED
Tetrahydrocannabinol: POSITIVE — AB

## 2023-08-30 MED ORDER — KETOROLAC TROMETHAMINE 15 MG/ML IJ SOLN
15.0000 mg | Freq: Once | INTRAMUSCULAR | Status: AC
  Administered 2023-08-31: 15 mg via INTRAVENOUS
  Filled 2023-08-30: qty 1

## 2023-08-30 MED ORDER — ONDANSETRON HCL 4 MG/2ML IJ SOLN
4.0000 mg | Freq: Once | INTRAMUSCULAR | Status: DC
  Filled 2023-08-30: qty 2

## 2023-08-30 MED ORDER — IOHEXOL 350 MG/ML SOLN
75.0000 mL | Freq: Once | INTRAVENOUS | Status: AC | PRN
  Administered 2023-08-30: 75 mL via INTRAVENOUS

## 2023-08-30 NOTE — ED Notes (Signed)
Pt endorsing chest/back pain and requesting something for same.  This RN messaged provider about same.

## 2023-08-30 NOTE — ED Triage Notes (Signed)
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

## 2023-08-30 NOTE — ED Provider Notes (Signed)
[facility] Provider Note   CSN: [id number] Arrival date & time: 08/30/23  1945     History  Chief Complaint  Patient presents with   Cough/SOB ( Hx. PE)     Back Pain     Kelly Lyons is a 27 y.o. female here for SOB and cough, phlegm production.  Patient recently had Bilateral PE in 08/13/23, hospitalized in [place] and [facility]. Discharged 10/29 and came home on 11/1 to be with mother. Since then breathing was improving, could have longer conversations w/o SOB, until sister got a cold and now patient has similar symptoms with cough, phlegm. No worsening SOB since discharge. On Eliquis, no missed doses. No fevers but feels chills/cold. Nauseous but no V/D. Has chest pain but this was there previously with last admission. Has worsened with cough and phlegm, worse today. Also has sore throat and dull headache. Eating less but drinking a lot of fluids. No hx asthma, copd. Stopped smoking after got a PE on 08/13/23. No immunocompromising conditions.   The history is provided by the patient and medical records.       Home Medications Prior to Admission medications   Medication Sig Start Date End Date Taking? Authorizing Provider  apixaban (ELIQUIS) 5 MG TABS tablet Take 5 mg by mouth 2 (two) times daily.   Yes [provider]  oxycodone (OXY-IR) 5 MG capsule Take 5 mg by mouth at bedtime as needed for pain.   Yes [provider]  pantoprazole (PROTONIX) 40 MG tablet Take 40 mg by mouth daily.   Yes [provider]  polyethylene glycol (MIRALAX / GLYCOLAX) 17 g packet Take 17 g by mouth daily as needed for mild constipation.   Yes [provider]  prazosin (MINIPRESS) 1 MG capsule Take 1 mg by mouth at bedtime.   Yes [provider]  sertraline (ZOLOFT) 50 MG tablet Take 50 mg by mouth daily.   Yes [provider]  traZODone (DESYREL) 50 MG tablet  Take 50 mg by mouth at bedtime.   Yes [provider]  acetaminophen (TYLENOL) 325 MG tablet Take 2 tablets (650 mg total) by mouth every 4 (four) hours as needed (for pain scale < 4). Patient not taking: Reported on 08/30/2023 08/14/19   Celedonio Savage, MD      Allergies    Patient has no known allergies.    Review of Systems   Review of Systems per HPI above  Physical Exam Updated Vital Signs BP 118/72 (BP Location: Right Arm)   Pulse 65   Temp 99 F (37.2 C) (Oral)   Resp 17   SpO2 100%  Physical Exam Constitutional:      General: She is not in acute distress.    Appearance: Normal appearance. She is not ill-appearing.  HENT:     Head: Normocephalic and atraumatic.     Nose: Nose normal.     Mouth/Throat:     Mouth: Mucous membranes are moist.  Eyes:     Extraocular Movements: Extraocular movements intact.     Pupils: Pupils are equal, round, and reactive to light.  Cardiovascular:     Rate and Rhythm: Normal rate and regular rhythm.     Pulses: Normal pulses.     Heart sounds: Normal heart sounds.  Pulmonary:     Effort: Pulmonary effort is normal.     Breath sounds: Normal breath sounds.  Abdominal:     General:  There is no distension.     Palpations: Abdomen is soft.     Tenderness: There is no abdominal tenderness. There is no guarding.  Musculoskeletal:     Cervical back: Neck supple.     Right lower leg: No edema.     Left lower leg: No edema.  Skin:    General: Skin is warm and dry.  Neurological:     General: No focal deficit present.     Mental Status: She is alert.     Cranial Nerves: No cranial nerve deficit.     Sensory: No sensory deficit.     Motor: No weakness.     Coordination: Coordination normal.     ED Results / Procedures / Treatments   Labs (all labs ordered are listed, but only abnormal results are displayed) Labs Reviewed  CBC WITH DIFFERENTIAL/PLATELET - Abnormal; Notable for the following components:      Result  Value   WBC 15.2 (*)    Platelets 507 (*)    Neutro Abs 12.1 (*)    All other components within normal limits  PROTIME-INR - Abnormal; Notable for the following components:   Prothrombin Time 15.4 (*)    All other components within normal limits  URINALYSIS, ROUTINE W REFLEX MICROSCOPIC - Abnormal; Notable for the following components:   APPearance HAZY (*)    Hgb urine dipstick SMALL (*)    All other components within normal limits  RAPID URINE DRUG SCREEN, HOSP PERFORMED - Abnormal; Notable for the following components:   Tetrahydrocannabinol POSITIVE (*)    All other components within normal limits  RESP PANEL BY RT-PCR (RSV, FLU A&B, COVID)  [id number]  COMPREHENSIVE METABOLIC PANEL  HCG, SERUM, QUALITATIVE  TROPONIN I (HIGH SENSITIVITY)    EKG EKG Interpretation Date/Time:  Thursday August 30 2023 20:27:04 EST Ventricular Rate:  80 PR Interval:  110 QRS Duration:  78 QT Interval:  360 QTC Calculation: 415 R Axis:   85  Text Interpretation: Sinus rhythm with short PR Otherwise normal ECG No previous ECGs available no prior ECG for comparis0on No STEMI Confirmed by Theda Belfast ([id number]) on 08/30/2023 8:36:00 PM  Radiology DG Chest 2 View  Result Date: 08/30/2023 CLINICAL DATA:  Shortness of breath, cough, back pain EXAM: CHEST - 2 VIEW COMPARISON:  06/26/2019 FINDINGS: The heart size and mediastinal contours are within normal limits. Both lungs are clear. The visualized skeletal structures are unremarkable. IMPRESSION: Normal study. Electronically Signed   By: Charlett Nose M.D.   On: 08/30/2023 21:37    Procedures Procedures    Medications Ordered in ED Medications  ondansetron (ZOFRAN) injection 4 mg (has no administration in time range)  ketorolac (TORADOL) 15 MG/ML injection 15 mg (has no administration in time range)  iohexol (OMNIPAQUE) 350 MG/ML injection 75 mL (75 mLs Intravenous Contrast Given 08/30/23 2334)    ED Course/ Medical Decision Making/ A&P                                  Medical Decision Making Amount and/or Complexity of Data Reviewed Labs: ordered. Decision-making details documented in ED Course. Radiology: ordered and independent interpretation performed. Decision-making details documented in ED Course. ECG/medicine tests: ordered and independent interpretation performed. Decision-making details documented in ED Course.  Risk Prescription drug management.   27 year old female who presents with known pulmonary emboli and recent admission with ongoing shortness of breath, worsening chest pain, new productive  cough and chills.  Differential considered includes pneumonia, viral illness, worsening pulmonary emboli, ACS, pericarditis myocarditis, aortic dissection.  Her physical exam is not consistent with sepsis as her vitals are stable and she is afebrile.  She does not appear acutely ill or dehydrated.  Her lungs are clear, her heart sounds are normal, she does not have signs of DVT or hypervolemia on exam doubt CHF.   EKG was obtained and is notable for sinus rhythm rate of 80, normal axis, normal intervals, no ST segment elevations or depressions, no pathologic T wave inversions, no signs of ischemia or pericarditis.   Labs were obtained and are notable for a leukocytosis of 15 with a neutrophilic predominance, normal hemoglobin of 14, normal CMP values, troponin is 3, pregnancy negative, no UTI on UA, normal INR mildly prolonged PT 15.4, covid/flu pending   CXR no cardiopulmonary abnormality   CTA PE obtained to evaluate for PNA missed by chest x-ray, worsening pulmonary emboli, or pulmonary abscess. Pending at signout.   Toradol given for pain IV.   Care handed to [facility] PA-C. Pending CTA PE. Plan is f/u CT. If negative for PE then likely d/c +/- abx for CAP pending what lung windows show. See their note for further details.           Final Clinical Impression(s) / ED Diagnoses Final diagnoses:  Acute cough     Rx / DC Orders ED Discharge Orders     None         Karmen Stabs, MD 08/30/23 2356    Tegeler, Canary Brim, MD 08/31/23 [phone]

## 2023-08-30 NOTE — ED Notes (Signed)
Pt transported to xray 

## 2023-08-31 LAB — RESP PANEL BY RT-PCR (RSV, FLU A&B, COVID)  RVPGX2
Influenza A by PCR: NEGATIVE
Influenza B by PCR: NEGATIVE
Resp Syncytial Virus by PCR: NEGATIVE
SARS Coronavirus 2 by RT PCR: NEGATIVE

## 2023-08-31 MED ORDER — DOXYCYCLINE HYCLATE 100 MG PO TABS
100.0000 mg | ORAL_TABLET | Freq: Once | ORAL | Status: AC
  Administered 2023-08-31: 100 mg via ORAL
  Filled 2023-08-31: qty 1

## 2023-08-31 MED ORDER — BENZONATATE 100 MG PO CAPS: 100.0000 mg | ORAL_CAPSULE | Freq: Three times a day (TID) | ORAL | 0 refills | Status: DC

## 2023-08-31 MED ORDER — DOXYCYCLINE HYCLATE 100 MG PO CAPS
100.0000 mg | ORAL_CAPSULE | Freq: Two times a day (BID) | ORAL | 0 refills | Status: DC
Start: 2023-08-31 — End: 2023-09-25

## 2023-08-31 MED ORDER — APIXABAN 5 MG PO TABS: 5.0000 mg | ORAL_TABLET | Freq: Two times a day (BID) | ORAL | 0 refills | Status: DC

## 2023-08-31 MED ORDER — APIXABAN 5 MG PO TABS
5.0000 mg | ORAL_TABLET | Freq: Once | ORAL | Status: AC
  Administered 2023-08-31: 5 mg via ORAL
  Filled 2023-08-31: qty 1

## 2023-08-31 NOTE — ED Provider Notes (Signed)
Results for orders placed or performed during the hospital encounter of 08/30/23  Resp panel by RT-PCR (RSV, Flu A&B, Covid) Anterior Nasal Swab   Specimen: Anterior Nasal Swab  Result Value Ref Range   SARS Coronavirus 2 by RT PCR NEGATIVE NEGATIVE   Influenza A by PCR NEGATIVE NEGATIVE   Influenza B by PCR NEGATIVE NEGATIVE   Resp Syncytial Virus by PCR NEGATIVE NEGATIVE  CBC with Differential  Result Value Ref Range   WBC 15.2 (H) 4.0 - 10.5 K/uL   RBC 4.68 3.87 - 5.11 MIL/uL   Hemoglobin 14.0 12.0 - 15.0 g/dL   HCT 30.8 65.7 - 84.6 %   MCV 83.8 80.0 - 100.0 fL   MCH 29.9 26.0 - 34.0 pg   MCHC 35.7 30.0 - 36.0 g/dL   RDW 96.2 95.2 - 84.1 %   Platelets 507 (H) 150 - 400 K/uL   nRBC 0.0 0.0 - 0.2 %   Neutrophils Relative % 80 %   Neutro Abs 12.1 (H) 1.7 - 7.7 K/uL   Lymphocytes Relative 13 %   Lymphs Abs 1.9 0.7 - 4.0 K/uL   Monocytes Relative 6 %   Monocytes Absolute 1.0 0.1 - 1.0 K/uL   Eosinophils Relative 1 %   Eosinophils Absolute 0.2 0.0 - 0.5 K/uL   Basophils Relative 0 %   Basophils Absolute 0.0 0.0 - 0.1 K/uL   Immature Granulocytes 0 %   Abs Immature Granulocytes 0.06 0.00 - 0.07 K/uL  Comprehensive metabolic panel  Result Value Ref Range   Sodium 135 135 - 145 mmol/L   Potassium 3.7 3.5 - 5.1 mmol/L   Chloride 103 98 - 111 mmol/L   CO2 22 22 - 32 mmol/L   Glucose, Bld 86 70 - 99 mg/dL   BUN 10 6 - 20 mg/dL   Creatinine, Ser 3.24 0.44 - 1.00 mg/dL   Calcium 9.5 8.9 - 40.1 mg/dL   Total Protein 7.6 6.5 - 8.1 g/dL   Albumin 3.5 3.5 - 5.0 g/dL   AST 23 15 - 41 U/L   ALT 27 0 - 44 U/L   Alkaline Phosphatase 63 38 - 126 U/L   Total Bilirubin 0.5 <1.2 mg/dL   GFR, Estimated >02 >72 mL/min   Anion gap 10 5 - 15  Protime-INR  Result Value Ref Range   Prothrombin Time 15.4 (H) 11.4 - 15.2 seconds   INR 1.2 0.8 - 1.2  hCG, serum, qualitative  Result Value Ref Range   Preg, Serum NEGATIVE NEGATIVE  Urinalysis, Routine w reflex microscopic -Urine, Clean Catch   Result Value Ref Range   Color, Urine YELLOW YELLOW   APPearance HAZY (A) CLEAR   Specific Gravity, Urine 1.020 1.005 - 1.030   pH 5.0 5.0 - 8.0   Glucose, UA NEGATIVE NEGATIVE mg/dL   Hgb urine dipstick SMALL (A) NEGATIVE   Bilirubin Urine NEGATIVE NEGATIVE   Ketones, ur NEGATIVE NEGATIVE mg/dL   Protein, ur NEGATIVE NEGATIVE mg/dL   Nitrite NEGATIVE NEGATIVE   Leukocytes,Ua NEGATIVE NEGATIVE   RBC / HPF 0-5 0 - 5 RBC/hpf   WBC, UA 0-5 0 - 5 WBC/hpf   Bacteria, UA NONE SEEN NONE SEEN   Squamous Epithelial / HPF 11-20 0 - 5 /HPF   Mucus PRESENT   Rapid urine drug screen (hospital performed)  Result Value Ref Range   Opiates NONE DETECTED NONE DETECTED   Cocaine NONE DETECTED NONE DETECTED   Benzodiazepines NONE DETECTED NONE DETECTED   Amphetamines NONE  DETECTED NONE DETECTED   Tetrahydrocannabinol POSITIVE (A) NONE DETECTED   Barbiturates NONE DETECTED NONE DETECTED  Troponin I (High Sensitivity)  Result Value Ref Range   Troponin I (High Sensitivity) 3 <18 ng/L   CT Angio Chest PE W and/or Wo Contrast  Result Date: 08/31/2023 CLINICAL DATA:  Pulmonary embolism (PE) suspected, high prob r/o worsening of known PE or pneumonia/abscess. Back pain. EXAM: CT ANGIOGRAPHY CHEST WITH CONTRAST TECHNIQUE: Multidetector CT imaging of the chest was performed using the standard protocol during bolus administration of intravenous contrast. Multiplanar CT image reconstructions and MIPs were obtained to evaluate the vascular anatomy. RADIATION DOSE REDUCTION: This exam was performed according to the departmental dose-optimization program which includes automated exposure control, adjustment of the mA and/or kV according to patient size and/or use of iterative reconstruction technique. CONTRAST:  75mL OMNIPAQUE IOHEXOL 350 MG/ML SOLN COMPARISON:  None Available. FINDINGS: Cardiovascular: No filling defects in the pulmonary arteries to suggest pulmonary emboli. Insert heart is Mediastinum/Nodes: No  mediastinal, hilar, or axillary adenopathy. Trachea and esophagus are unremarkable. Thyroid unremarkable. Lungs/Pleura: Nodular ground-glass airspace disease in both lower lobes, left greater than right as well as the left upper lobe. Findings most compatible with multifocal pneumonia. Pleural based masslike density seen in the posterior right lower lobe measuring 2.1 cm. No effusions. Upper Abdomen: No acute findings Musculoskeletal: Chest wall soft tissues are unremarkable. No acute bony abnormality. Review of the MIP images confirms the above findings. IMPRESSION: No evidence of pulmonary embolus. Nodular ground-glass airspace disease in the lower lobes and left upper lobe most compatible with multifocal pneumonia. Some of the areas are masslike, particularly a pleural-based area in the posterior right lower lobe measuring up to 2.1 cm. Chest CT is recommended in 3-4 weeks following trial of antibiotic therapy to ensure resolution. Electronically Signed   By: Charlett Nose M.D.   On: 08/31/2023 00:25   DG Chest 2 View  Result Date: 08/30/2023 CLINICAL DATA:  Shortness of breath, cough, back pain EXAM: CHEST - 2 VIEW COMPARISON:  06/26/2019 FINDINGS: The heart size and mediastinal contours are within normal limits. Both lungs are clear. The visualized skeletal structures are unremarkable. IMPRESSION: Normal study. Electronically Signed   By: Charlett Nose M.D.   On: 08/30/2023 21:37    CTA with findings of multifocal PNA.  RVP is negative.  Continues saturating well on RA without respiratory distress.  Feel she is stable for OP management.  She has appt with new PCP on 09/06/23.  Will start doxycycline and tessalon.  Can return here for new concerns.   Garlon Hatchet, PA-C 08/31/23 0216    Marily Memos, MD 08/31/23 [phone]

## 2023-08-31 NOTE — Discharge Instructions (Signed)
Take the prescribed medication as directed. Follow-up with your doctor next week as scheduled. Return to the ED for new or worsening symptoms.

## 2023-09-06 ENCOUNTER — Ambulatory Visit (HOSPITAL_BASED_OUTPATIENT_CLINIC_OR_DEPARTMENT_OTHER): Payer: Medicaid Other | Admitting: Family Medicine

## 2023-09-06 ENCOUNTER — Encounter (HOSPITAL_BASED_OUTPATIENT_CLINIC_OR_DEPARTMENT_OTHER): Payer: Self-pay | Admitting: Family Medicine

## 2023-09-06 VITALS — BP 102/65 | HR 75 | Temp 98.2°F | Ht 60.25 in | Wt 118.8 lb

## 2023-09-06 DIAGNOSIS — I2699 Other pulmonary embolism without acute cor pulmonale: Secondary | ICD-10-CM | POA: Insufficient documentation

## 2023-09-06 DIAGNOSIS — K648 Other hemorrhoids: Secondary | ICD-10-CM | POA: Diagnosis not present

## 2023-09-06 DIAGNOSIS — Z7689 Persons encountering health services in other specified circumstances: Secondary | ICD-10-CM | POA: Diagnosis not present

## 2023-09-06 DIAGNOSIS — J189 Pneumonia, unspecified organism: Secondary | ICD-10-CM

## 2023-09-06 MED ORDER — APIXABAN 5 MG PO TABS: 5.0000 mg | ORAL_TABLET | Freq: Two times a day (BID) | ORAL | 3 refills | Status: DC

## 2023-09-06 MED ORDER — ALBUTEROL SULFATE HFA 108 (90 BASE) MCG/ACT IN AERS
2.0000 | INHALATION_SPRAY | Freq: Four times a day (QID) | RESPIRATORY_TRACT | 2 refills | Status: DC | PRN
Start: 2023-09-06 — End: 2024-06-24

## 2023-09-06 MED ORDER — FLUCONAZOLE 150 MG PO TABS: 150.0000 mg | ORAL_TABLET | Freq: Once | ORAL | 0 refills | Status: DC

## 2023-09-06 MED ORDER — AMOXICILLIN 500 MG PO CAPS: 1000.0000 mg | ORAL_CAPSULE | Freq: Three times a day (TID) | ORAL | 0 refills | Status: AC

## 2023-09-06 NOTE — Patient Instructions (Addendum)
I will order your repeat CT Chest Scan for the week of December 5th. Please do not have this done until that week. Please complete your antibiotic therapy.

## 2023-09-06 NOTE — Progress Notes (Signed)
New Patient Office Visit  Subjective:   Kelly Lyons [birth year] 09/06/2023  Chief Complaint  Patient presents with   Establish Care    HPI: Kelly Lyons presents today to establish care at [facility] at [facility]. Introduced to Publishing rights manager role and practice setting.  All questions answered.   Last PCP: None Last annual physical: n/a Concerns: See below   Patient reports to establish for primary care provider since moving from [place] after recent hospitalization.  Patient states she had a wisdom tooth removal on October 7 and began having right sided chest pain for a week prior.  She went to the ER on October 21.  She was also breathless and had significant shortness of breath.  She had a history of cigarette, marijuana use and hormonal contraceptive use.  She denied fevers or chills, she was taking her oxycodone per her dentist following surgery without any relief.  CT PE angio showed findings consistent with acute bilateral pulmonary thromboembolism with mild right ventricular strain and associated pulmonary infarction in the right lower lobe.  Initially patient was hypoxic requiring oxygen via nasal cannula but was able to wean off.  She was started on IV heparin and transition to Xarelto.  She had a complication of bright red rectal bleeding, placed back on IV heparin, and recommended follow-up with GI for possible internal hemorrhoids upon discharge.  Patient was transition to Eliquis on October 28 after resolution of rectal bleeding and was discharged on October 29.  She was recommended to follow-up with pulmonology and hematology.  Mom denies personal history inpatient of prior DVT, PE or coagulation disorder.  Reports history of DVT and PE in maternal and paternal aunts, but no immediate family members   Patient moved from [place] to [place] to recover with her mom after hospitalization.  She presented to  [facility] emergency department on November 7 and congestive cough.  She was very compliant with her Eliquis.  She had mild leukocytosis of 15 with increase in neutrophils, normal CMP, normal protein, mildly prolonged INR at 15.4.  Chest x-ray was unremarkable.  CTA showed multifocal pneumonia left greater than right with pleural-based masslike density in the posterior right lower lobe measuring 2.1 cm.  Chest CT recommended in 3 to 4 weeks following antibiotic therapy to ensure resolution.  Patient was placed on course of doxycycline.  She states that she is still had significant shortness of breath, cough, and fatigue.  Shortness of breath worsens with exertion.  She was not given any other antibiotic therapy and is currently completing doxycycline.  She also was not given any albuterol inhaler or nebulization.  HEMORRHOIDS:  Patient recommended to follow-up with gastroenterology due to episode of bright red rectal bleeding while hospitalized.  Bleeding resolved after discontinuation of Xarelto and was thought to be related to possible internal hemorrhoids.  Patient had CTA abdomen pelvis on October 25 which showed mild diffuse wall thickening involving the anal canal with mucosal enhancement.  No evidence of inflammatory process or acute issues. Patient denies current melena or bright red rectal bleeding.  She has been having some constipation and has been using MiraLAX with relief.  Would like to follow-up with GI for possible internal hemorrhoids.  The following portions of the patient's history were reviewed and updated as appropriate: past medical history, past surgical history, family history, social history, allergies, medications, and problem list.   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 No pertinent family history. Social History   Socioeconomic History   Marital status: Single    Spouse name: Not on file   Number of children: Not on file   Years of education: Not on file   Highest education level: 12th grade  Occupational History   Not on file  Tobacco Use   Smoking status: Never   Smokeless tobacco: Never  Vaping Use   Vaping status: Never Used  Substance and Sexual Activity   Alcohol use: No   Drug use: Yes    Types: Marijuana   Sexual activity: Yes  Other Topics Concern   Not on file  Social History Narrative   Not on file   Social Determinants of Health   Financial Resource Strain: Low Risk  (09/05/2023)   Overall Financial Resource Strain (CARDIA)    Difficulty of Paying Living Expenses: Not hard at all  Food Insecurity: No Food Insecurity (09/05/2023)   Hunger Vital Sign    Worried About Running Out of Food in the Last Year: Never true    Ran Out of Food in the Last Year: Never true  Transportation Needs: Unmet Transportation Needs (09/05/2023)   PRAPARE - Transportation    Lack of Transportation (Medical): Yes    Lack of Transportation (Non-Medical): Yes  Physical Activity: Unknown (09/05/2023)   Exercise Vital Sign    Days of Exercise per Week: Patient declined    Minutes of Exercise per Session: Not on file  Stress: Patient Declined (09/05/2023)   [employer] of Occupational Health - Occupational Stress Questionnaire    Feeling of Stress : Patient declined  Social Connections: Unknown (09/05/2023)   Social Connection and Isolation Panel [NHANES]    Frequency of Communication with Friends and Family: More than three times a week    Frequency of Social Gatherings with Friends  and Family: Once a week    Attends Religious Services: Patient declined    Database administrator or Organizations: No    Attends Engineer, structural: Not on file    Marital Status: Never married  Intimate Partner Violence: Unknown (01/27/2022)   Received from [facility]   HITS    Physically Hurt: Not on file    Insult or Talk Down To: Not on file    Threaten Physical Harm: Not on file    Scream or Curse: Not on file   Outpatient Medications Prior to Visit  Medication Sig Dispense Refill   benzonatate (TESSALON) 100 MG capsule Take 1 capsule (100 mg total) by mouth every 8 (eight) hours. 21 capsule 0   doxycycline (VIBRAMYCIN) 100 MG capsule Take 1 capsule (100 mg total) by mouth 2 (two) times daily. 20 capsule 0   oxycodone (OXY-IR) 5 MG capsule Take 5 mg by mouth at bedtime as needed for pain.     pantoprazole (PROTONIX) 40 MG tablet Take 40 mg by mouth daily.     polyethylene glycol (MIRALAX / GLYCOLAX) 17 g packet Take 17 g by mouth daily as needed  for mild constipation.     prazosin (MINIPRESS) 1 MG capsule Take 1 mg by mouth at bedtime.     sertraline (ZOLOFT) 50 MG tablet Take 50 mg by mouth daily.     traZODone (DESYREL) 50 MG tablet Take 50 mg by mouth at bedtime.     apixaban (ELIQUIS) 5 MG TABS tablet Take 1 tablet (5 mg total) by mouth 2 (two) times daily. 60 tablet 0   acetaminophen (TYLENOL) 325 MG tablet Take 2 tablets (650 mg total) by mouth every 4 (four) hours as needed (for pain scale < 4). (Patient not taking: Reported on 08/30/2023)     No facility-administered medications prior to visit.   No Known Allergies  ROS: A complete ROS was performed with pertinent positives/negatives noted in the HPI. The remainder of the ROS are negative.   Objective:   Today's Vitals   09/06/23 1334  BP: 102/65  Pulse: 75  Temp: 98.2 F (36.8 C)  TempSrc: Oral  SpO2: 98%  Weight: 118 lb 12.8 oz (53.9 kg)  Height: 5' 0.25" (1.53 m)  PainSc: 0-No pain     GENERAL: Well-appearing, in NAD. Well nourished.  Patient is not ill-appearing or presenting with any toxic appearance. SKIN: Pink, warm and dry. No rash, lesion, ulceration, or ecchymoses.  Head: Normocephalic. NECK: Trachea midline. Full ROM w/o pain or tenderness. No lymphadenopathy.  EARS: Tympanic membranes are intact, translucent without bulging and without drainage. Appropriate landmarks visualized.  EYES: Conjunctiva clear without exudates. EOMI, PERRL, no drainage present.  RESPIRATORY: Chest wall symmetrical. Respirations even and non-labored. Breath sounds clear to auscultation bilaterally.  Cough is congested, nonproductive. CARDIAC: S1, S2 present, regular rate and rhythm without murmur or gallops. Peripheral pulses 2+ bilaterally.  MSK: Muscle tone and strength appropriate for age.  EXTREMITIES: Without clubbing, cyanosis, or edema.  NEUROLOGIC: No motor or sensory deficits. Steady, even gait. C2-C12 intact.  PSYCH/MENTAL STATUS: Alert, oriented x 3. Cooperative, appropriate mood and affect.      Assessment & Plan:  1. Encounter to establish care with new doctor Patient due for annual exam will obtain baseline labs with lipids and TSH when fasting.  We did review her previous lab work with ER and recent hospitalizations in depth.  We will complete health maintenance items as appropriate with upcoming physical exam. - Lipid panel - TSH  2. Bilateral pulmonary embolism (HCC) 3. Pulmonary embolism and infarction (HCC) 4.  Multifocal pneumonia Eliquis refilled.  Patient is completing doxycycline therapy, amoxicillin 1 g 3 times daily for 7 days added on for dual therapy for multifocal pneumonia.  Diflucan provided if yeast infection occurs post antibiotic therapy.  Patient also given albuterol inhaler to use as needed for shortness of breath.  Recommend patient also start using Mucinex for congestion and antihistamine if needed.  Patient referred to hematology, pulmonology.   Will repeat her CBC upcoming to monitor platelet count and blood counts.  CTA repeat ordered for 3 weeks to occur approximately December 5 for evaluation of pneumonia resolution and progress of PEs with anticoagulant therapy.  - apixaban (ELIQUIS) 5 MG TABS tablet; Take 1 tablet (5 mg total) by mouth 2 (two) times daily.  Dispense: 60 tablet; Refill: 3 - Ambulatory referral to Hematology / Oncology - Ambulatory referral to Pulmonology - CBC with Differential/Platelet - Ambulatory referral to Pulmonology  4. Internal hemorrhoid Resolved currently.  Referral placed to gastroenterology for further evaluation per patient request.  Discussed continuing good bowel habits and avoiding constipation with increased  fiber, hydration, and using MiraLAX as needed. - Ambulatory referral to Gastroenterology    Patient to reach out to office if new, worrisome, or unresolved symptoms arise or if no improvement in patient's condition. Patient verbalized understanding and is agreeable to treatment plan. All questions answered to patient's satisfaction.    Return in about 3 weeks (around 09/27/2023) for Follow up Pneumonia/PE.    Yolanda Manges, FNP

## 2023-09-07 LAB — CBC WITH DIFFERENTIAL/PLATELET
Basophils Absolute: 0 10*3/uL (ref 0.0–0.2)
Basos: 0 %
EOS (ABSOLUTE): 0.2 10*3/uL (ref 0.0–0.4)
Eos: 2 %
Hematocrit: 39 % (ref 34.0–46.6)
Hemoglobin: 13 g/dL (ref 11.1–15.9)
Immature Grans (Abs): 0.1 10*3/uL (ref 0.0–0.1)
Immature Granulocytes: 1 %
Lymphocytes Absolute: 2.6 10*3/uL (ref 0.7–3.1)
Lymphs: 29 %
MCH: 29.5 pg (ref 26.6–33.0)
MCHC: 33.3 g/dL (ref 31.5–35.7)
MCV: 88 fL (ref 79–97)
Monocytes Absolute: 0.6 10*3/uL (ref 0.1–0.9)
Monocytes: 7 %
Neutrophils Absolute: 5.3 10*3/uL (ref 1.4–7.0)
Neutrophils: 61 %
Platelets: 419 10*3/uL (ref 150–450)
RBC: 4.41 x10E6/uL (ref 3.77–5.28)
RDW: 12.6 % (ref 11.7–15.4)
WBC: 8.7 10*3/uL (ref 3.4–10.8)

## 2023-09-07 LAB — LIPID PANEL
Chol/HDL Ratio: 2.4 ratio (ref 0.0–4.4)
Cholesterol, Total: 204 mg/dL — ABNORMAL HIGH (ref 100–199)
HDL: 84 mg/dL (ref >39)
LDL Chol Calc (NIH): 109 mg/dL — ABNORMAL HIGH (ref 0–99)
Triglycerides: 58 mg/dL (ref 0–149)
VLDL Cholesterol Cal: 11 mg/dL (ref 5–40)

## 2023-09-07 LAB — TSH: TSH: 1.56 u[IU]/mL (ref 0.450–4.500)

## 2023-09-10 NOTE — Progress Notes (Signed)
Kelly Lyons,  Your white blood cell count is back to normal which is reassuring for pneumonia clearance. How are you feeling? It looks like you have scheduled your CT as well. I will let you know what it says when you complete it in December. Let me know if you do not feel like you are improving on the antibiotic and albuterol.

## 2023-09-18 ENCOUNTER — Encounter (HOSPITAL_BASED_OUTPATIENT_CLINIC_OR_DEPARTMENT_OTHER): Payer: Self-pay | Admitting: Family Medicine

## 2023-09-24 ENCOUNTER — Ambulatory Visit
Admission: RE | Admit: 2023-09-24 | Discharge: 2023-09-24 | Disposition: A | Discharge status: Home / Self Care | Payer: Medicaid Other | Source: Ambulatory Visit | Attending: Family Medicine | Admitting: Family Medicine

## 2023-09-24 DIAGNOSIS — I2699 Other pulmonary embolism without acute cor pulmonale: Secondary | ICD-10-CM

## 2023-09-24 DIAGNOSIS — J189 Pneumonia, unspecified organism: Secondary | ICD-10-CM

## 2023-09-24 MED ORDER — IOPAMIDOL (ISOVUE-370) INJECTION 76%
200.0000 mL | Freq: Once | INTRAVENOUS | Status: AC | PRN
  Administered 2023-09-24: 75 mL via INTRAVENOUS

## 2023-09-25 ENCOUNTER — Inpatient Hospital Stay: Payer: Medicaid Other

## 2023-09-25 ENCOUNTER — Inpatient Hospital Stay: Payer: Medicaid Other | Attending: Oncology | Admitting: Oncology

## 2023-09-25 ENCOUNTER — Encounter: Payer: Self-pay | Admitting: Oncology

## 2023-09-25 VITALS — BP 115/64 | HR 81 | Temp 98.1°F | Resp 18 | Ht 60.25 in | Wt 128.9 lb

## 2023-09-25 DIAGNOSIS — Z7901 Long term (current) use of anticoagulants: Secondary | ICD-10-CM | POA: Diagnosis not present

## 2023-09-25 DIAGNOSIS — Z0000 Encounter for general adult medical examination without abnormal findings: Secondary | ICD-10-CM | POA: Insufficient documentation

## 2023-09-25 DIAGNOSIS — Z87891 Personal history of nicotine dependence: Secondary | ICD-10-CM | POA: Diagnosis not present

## 2023-09-25 DIAGNOSIS — I2699 Other pulmonary embolism without acute cor pulmonale: Secondary | ICD-10-CM | POA: Insufficient documentation

## 2023-09-25 DIAGNOSIS — D573 Sickle-cell trait: Secondary | ICD-10-CM | POA: Insufficient documentation

## 2023-09-25 LAB — CBC WITH DIFFERENTIAL/PLATELET
Abs Immature Granulocytes: 0.01 10*3/uL (ref 0.00–0.07)
Basophils Absolute: 0 10*3/uL (ref 0.0–0.1)
Basophils Relative: 0 %
Eosinophils Absolute: 0.3 10*3/uL (ref 0.0–0.5)
Eosinophils Relative: 5 %
HCT: 38.1 % (ref 36.0–46.0)
Hemoglobin: 13.5 g/dL (ref 12.0–15.0)
Immature Granulocytes: 0 %
Lymphocytes Relative: 48 %
Lymphs Abs: 2.4 10*3/uL (ref 0.7–4.0)
MCH: 29.5 pg (ref 26.0–34.0)
MCHC: 35.4 g/dL (ref 30.0–36.0)
MCV: 83.4 fL (ref 80.0–100.0)
Monocytes Absolute: 0.4 10*3/uL (ref 0.1–1.0)
Monocytes Relative: 9 %
Neutro Abs: 1.9 10*3/uL (ref 1.7–7.7)
Neutrophils Relative %: 38 %
Platelets: 284 10*3/uL (ref 150–400)
RBC: 4.57 MIL/uL (ref 3.87–5.11)
RDW: 14 % (ref 11.5–15.5)
WBC: 5 10*3/uL (ref 4.0–10.5)
nRBC: 0 % (ref 0.0–0.2)

## 2023-09-25 LAB — COMPREHENSIVE METABOLIC PANEL
ALT: 20 U/L (ref 0–44)
AST: 17 U/L (ref 15–41)
Albumin: 4.5 g/dL (ref 3.5–5.0)
Alkaline Phosphatase: 58 U/L (ref 38–126)
Anion gap: 8 (ref 5–15)
BUN: 12 mg/dL (ref 6–20)
CO2: 26 mmol/L (ref 22–32)
Calcium: 9.6 mg/dL (ref 8.9–10.3)
Chloride: 104 mmol/L (ref 98–111)
Creatinine, Ser: 0.83 mg/dL (ref 0.44–1.00)
GFR, Estimated: >60 mL/min (ref >60)
Glucose, Bld: 80 mg/dL (ref 70–99)
Potassium: 3.8 mmol/L (ref 3.5–5.1)
Sodium: 138 mmol/L (ref 135–145)
Total Bilirubin: 0.7 mg/dL (ref <1.2)
Total Protein: 7.6 g/dL (ref 6.5–8.1)

## 2023-09-25 LAB — D-DIMER, QUANTITATIVE: D-Dimer, Quant: <0.27 ug{FEU}/mL (ref 0.00–0.50)

## 2023-09-25 NOTE — Assessment & Plan Note (Signed)
History of bilateral pulmonary embolism following wisdom tooth extraction. No known risk factors identified. Currently on Eliquis 5 mg twice daily with resolution of clots on recent imaging. Family history of blood clots in distant aunts. -Continue Eliquis 5mg  twice daily for a minimum of six months, possibly extending to one year.  -I reviewed hypercoagulable workup that was done at outside facility in [place] in October 2024: Factor V Leiden mutation, prothrombin gene mutation were both negative.  Protein S antigen and Antithrombin 3 antigen level were both low but these could be falsely low because of acute clot.  Confirmatory testing for lupus anticoagulant was negative.  Anticardiolipin IgM was indeterminate at 15 units/mL.  Anticardiolipin IgG and IgA were undetectable.  Beta-2 glycoprotein antibodies were negative.  -D-dimer was undetectable today.  -We pursued additional hypercoagulable workup for completion today: I repeated anticardiolipin antibody, beta-2 glycoprotein antibody, lupus anticoagulant panel.  We also checked protein C activity, protein S activity, Antithrombin III activity.  Will follow-up on results.  -Plan for a phone call visit in two weeks to discuss test results.  -Schedule follow-up visit in four months (late April 2025) to reassess after completion of six months of anticoagulation therapy.  -If patient were to have recurrence of thromboembolism, she will need indefinite anticoagulation and she verbalized understanding of this fact.  Discussed symptoms to watch for including chest pain, shortness of breath, dizziness, lightheadedness, leg swelling, redness or pain etc.

## 2023-09-25 NOTE — Assessment & Plan Note (Signed)
-  Flu vaccine is recommended. -Ensure Pap smear is up to date as part of routine cancer screening.

## 2023-09-25 NOTE — Progress Notes (Signed)
[facility]  HEMATOLOGY CLINIC CONSULTATION NOTE    PATIENT NAME: Kelly Lyons   MR#: [id number] DOB: 1996-08-08  DATE OF SERVICE: 09/25/2023  REFERRING PHYSICIAN  Caudle, Shelton Silvas, FNP   Patient Care Team: Hilbert Bible, FNP as PCP - General (Family Medicine)   REASON FOR CONSULTATION/ CHIEF COMPLAINT:  Bilateral pulmonary embolism diagnosed on 08/13/2023  HISTORY OF PRESENT ILLNESS:  Kelly Lyons is a 27 y.o. lady with a past medical history of internal hemorrhoids, sickle cell trait, was referred to our service for history of bilateral pulmonary embolism diagnosed on 08/13/2023 in [place].  Discussed the use of AI scribe software for clinical note transcription with the patient, who gave verbal consent to proceed.   Patient reports to establish for primary care provider since moving from [place] after recent hospitalization. Patient states she had a wisdom tooth removal on October 7 and began having right sided chest pain for a week prior. She went to the ER on October 21. She was also breathless and had significant shortness of breath. She had a history of cigarette, marijuana use and hormonal contraceptive use (IUD-Mirena). She denied fevers or chills, she was taking her oxycodone per her dentist following surgery without any relief. CT PE angio showed findings consistent with acute bilateral pulmonary thromboembolism with mild right ventricular strain and associated pulmonary infarction in the right lower lobe. Initially patient was hypoxic requiring oxygen via nasal cannula but was able to wean off. She was started on IV heparin and transitioned to Xarelto.  Factor V Leiden mutation, prothrombin gene mutation were both negative.  Protein S antigen and Antithrombin 3 antigen level were both low but these could be falsely low because of acute clot.  Confirmatory testing for lupus anticoagulant was negative.  Anticardiolipin IgM was  indeterminate at 15 units/mL.  Anticardiolipin IgG and IgA were undetectable.  Beta-2 glycoprotein antibodies were negative.  USG was negative for DVT in both LEs.  She had a complication of bright red rectal bleeding, placed back on IV heparin, and recommended follow-up with GI for possible internal hemorrhoids upon discharge.   Patient was transitioned to Eliquis on October 28 after resolution of rectal bleeding and was discharged on October 29. She was recommended to follow-up with pulmonology and hematology. Mom denies personal history inpatient of prior DVT, PE or coagulation disorder. Reports history of DVT and PE in maternal and paternal aunts, but no immediate family members.  She presented to [facility] emergency department on November 7 with congestive cough. She was very compliant with her Eliquis. She had mild leukocytosis of 15 with increase in neutrophils, normal CMP, normal protein, mildly prolonged INR at 15.4. Chest x-ray was unremarkable. CTA showed multifocal pneumonia left greater than right with pleural-based masslike density in the posterior right lower lobe measuring 2.1 cm. Chest CT recommended in 3 to 4 weeks following antibiotic therapy to ensure resolution. Patient was placed on course of doxycycline.   CT chest PE protocol on 08/31/2023 and again on 09/24/2023 showed no evidence of PE now.  The patient reports no prior history of immobility or bed rest before the hospitalization, but was feeling unwell and unable to get up and do much. The patient has never been on oral contraceptives, only an IUD for birth control. The patient has a four-year-old child and had an eventful pregnancy with vaginal bleeding and bed rest a week before delivery at full term.  No history of miscarriages or abortion prior to that.  The patient is a former smoker, having smoked cigars in the past.   She denies fever, cough, diarrhea, or other infectious symptoms.  She denies epistaxis, bloody stool,  melena, hematuria, bruising or other bleeding symptoms. She also denies unintentional weight loss, night sweats or other constitutional symptoms.  MEDICAL HISTORY Past Medical History:  Diagnosis Date   Eczema    on face   Pulmonary embolism [facility])      SURGICAL HISTORY Past Surgical History:  Procedure Laterality Date   SHOULDER ARTHROSCOPY  December   For severely dislocated shoulder     SOCIAL HISTORY: She reports that she has quit smoking. Her smoking use included cigars. She has never used smokeless tobacco. She reports current drug use. Drug: Marijuana. She reports that she does not drink alcohol. Social History   Socioeconomic History   Marital status: Single    Spouse name: Not on file   Number of children: Not on file   Years of education: Not on file   Highest education level: 12th grade  Occupational History   Not on file  Tobacco Use   Smoking status: Former    Types: Cigars   Smokeless tobacco: Never  Vaping Use   Vaping status: Never Used  Substance and Sexual Activity   Alcohol use: No   Drug use: Yes    Types: Marijuana   Sexual activity: Yes  Other Topics Concern   Not on file  Social History Narrative   Not on file   Social Determinants of Health   Financial Resource Strain: Low Risk  (09/05/2023)   Overall Financial Resource Strain (CARDIA)    Difficulty of Paying Living Expenses: Not hard at all  Food Insecurity: No Food Insecurity (09/25/2023)   Hunger Vital Sign    Worried About Running Out of Food in the Last Year: Never true    Ran Out of Food in the Last Year: Never true  Transportation Needs: Unmet Transportation Needs (09/25/2023)   PRAPARE - Transportation    Lack of Transportation (Medical): Yes    Lack of Transportation (Non-Medical): Yes  Physical Activity: Unknown (09/05/2023)   Exercise Vital Sign    Days of Exercise per Week: Patient declined    Minutes of Exercise per Session: Not on file  Stress: Patient Declined  (09/05/2023)   [employer] of Occupational Health - Occupational Stress Questionnaire    Feeling of Stress : Patient declined  Social Connections: Unknown (09/05/2023)   Social Connection and Isolation Panel [NHANES]    Frequency of Communication with Friends and Family: More than three times a week    Frequency of Social Gatherings with Friends and Family: Once a week    Attends Religious Services: Patient declined    Database administrator or Organizations: No    Attends Engineer, structural: Not on file    Marital Status: Never married  Intimate Partner Violence: Not At Risk (09/25/2023)   Humiliation, Afraid, Rape, and Kick questionnaire    Fear of Current or Ex-Partner: No    Emotionally Abused: No    Physically Abused: No    Sexually Abused: No    FAMILY HISTORY: Her family history is not on file.  CURRENT MEDICATIONS   Current Outpatient Medications  Medication Instructions   acetaminophen (TYLENOL) 650 mg, Oral, Every 4 hours PRN   albuterol (VENTOLIN HFA) 108 (90 Base) MCG/ACT inhaler 2 puffs, Inhalation, Every 6 hours PRN   apixaban (ELIQUIS) 5 mg, Oral, 2 times daily  oxycodone (OXY-IR) 5 mg, At bedtime PRN   pantoprazole (PROTONIX) 40 mg, Oral, Daily   polyethylene glycol (MIRALAX / GLYCOLAX) 17 g, Daily PRN   prazosin (MINIPRESS) 1 mg, Oral, Daily at bedtime   sertraline (ZOLOFT) 50 mg, Oral, Daily   traZODone (DESYREL) 50 mg, Oral, Daily at bedtime     ALLERGIES  She has No Known Allergies.  REVIEW OF SYSTEMS:  Review of Systems - Oncology   Rest of the pertinent review of systems is unremarkable except as mentioned above in HPI.  PHYSICAL EXAMINATION:  ECOG PERFORMANCE STATUS: 1 - Symptomatic but completely ambulatory  Vitals:   09/25/23 1302  BP: 115/64  Pulse: 81  Resp: 18  Temp: 98.1 F (36.7 C)  SpO2: 99%   Filed Weights   09/25/23 1302  Weight: 128 lb 14.4 oz (58.5 kg)    Physical Exam Constitutional:      General:  She is not in acute distress.    Appearance: Normal appearance.  HENT:     Head: Normocephalic and atraumatic.  Eyes:     General: No scleral icterus.    Conjunctiva/sclera: Conjunctivae normal.  Cardiovascular:     Rate and Rhythm: Normal rate and regular rhythm.     Heart sounds: Normal heart sounds.  Pulmonary:     Effort: Pulmonary effort is normal.     Breath sounds: Normal breath sounds.  Abdominal:     General: There is no distension.  Musculoskeletal:     Right lower leg: No edema.     Left lower leg: No edema.  Neurological:     General: No focal deficit present.     Mental Status: She is alert and oriented to person, place, and time.  Psychiatric:        Mood and Affect: Mood normal.        Behavior: Behavior normal.        Thought Content: Thought content normal.     LABORATORY DATA:   I have reviewed the data as listed.  Results for orders placed or performed in visit on 09/25/23  D-dimer, quantitative  Result Value Ref Range   D-Dimer, Quant <0.27 0.00 - 0.50 ug/mL-FEU  CBC with Differential/Platelet  Result Value Ref Range   WBC 5.0 4.0 - 10.5 K/uL   RBC 4.57 3.87 - 5.11 MIL/uL   Hemoglobin 13.5 12.0 - 15.0 g/dL   HCT 84.6 96.2 - 95.2 %   MCV 83.4 80.0 - 100.0 fL   MCH 29.5 26.0 - 34.0 pg   MCHC 35.4 30.0 - 36.0 g/dL   RDW 84.1 32.4 - 40.1 %   Platelets 284 150 - 400 K/uL   nRBC 0.0 0.0 - 0.2 %   Neutrophils Relative % 38 %   Neutro Abs 1.9 1.7 - 7.7 K/uL   Lymphocytes Relative 48 %   Lymphs Abs 2.4 0.7 - 4.0 K/uL   Monocytes Relative 9 %   Monocytes Absolute 0.4 0.1 - 1.0 K/uL   Eosinophils Relative 5 %   Eosinophils Absolute 0.3 0.0 - 0.5 K/uL   Basophils Relative 0 %   Basophils Absolute 0.0 0.0 - 0.1 K/uL   Immature Granulocytes 0 %   Abs Immature Granulocytes 0.01 0.00 - 0.07 K/uL  Comprehensive metabolic panel  Result Value Ref Range   Sodium 138 135 - 145 mmol/L   Potassium 3.8 3.5 - 5.1 mmol/L   Chloride 104 98 - 111 mmol/L   CO2  26 22 - 32 mmol/L  Glucose, Bld 80 70 - 99 mg/dL   BUN 12 6 - 20 mg/dL   Creatinine, Ser 1.61 0.44 - 1.00 mg/dL   Calcium 9.6 8.9 - 09.6 mg/dL   Total Protein 7.6 6.5 - 8.1 g/dL   Albumin 4.5 3.5 - 5.0 g/dL   AST 17 15 - 41 U/L   ALT 20 0 - 44 U/L   Alkaline Phosphatase 58 38 - 126 U/L   Total Bilirubin 0.7 <1.2 mg/dL   GFR, Estimated >04 >54 mL/min   Anion gap 8 5 - 15     RADIOGRAPHIC STUDIES:  I have personally reviewed the radiological images as listed and agreed with the findings in the report.  CT Angio Chest Pulmonary Embolism (PE) W or WO Contrast  Result Date: 09/24/2023 CLINICAL DATA:  Chronic shortness of breath, PE with pulmonary infarct. Multifocal pneumonia. EXAM: CT ANGIOGRAPHY CHEST WITH CONTRAST TECHNIQUE: Multidetector CT imaging of the chest was performed using the standard protocol during bolus administration of intravenous contrast. Multiplanar CT image reconstructions and MIPs were obtained to evaluate the vascular anatomy. RADIATION DOSE REDUCTION: This exam was performed according to the departmental dose-optimization program which includes automated exposure control, adjustment of the mA and/or kV according to patient size and/or use of iterative reconstruction technique. CONTRAST:  75mL ISOVUE-370 IOPAMIDOL (ISOVUE-370) INJECTION 76% COMPARISON:  08/30/2023. FINDINGS: Cardiovascular: Negative for pulmonary embolus. Heart is at the upper limits of normal in size. No pericardial effusion. Mediastinum/Nodes: Thymic tissue in the prevascular space. No pathologically enlarged mediastinal, hilar or axillary lymph nodes. Esophagus is grossly unremarkable. Lungs/Pleura: 3 mm anterior segment right upper lobe nodule (12/70), considered benign in a patient of this age. Improving areas of airspace consolidation in the lower lobes, with residual ground-glass and subpleural consolidation. No pleural fluid. Airway is unremarkable. Upper Abdomen: Blush of hyperattenuation in the  right hepatic lobe (4/139), likely a flash fill hemangioma or perfusion anomaly for which no specific follow-up is necessary. Visualized portions of the liver, adrenal glands, kidneys, spleen, pancreas, stomach and bowel are otherwise grossly unremarkable. No upper abdominal adenopathy. Musculoskeletal: None. Review of the MIP images confirms the above findings. IMPRESSION: 1. Negative for pulmonary embolus. 2. Improving bibasilar pneumonia without complete resolution. Electronically Signed   By: Leanna Battles M.D.   On: 09/24/2023 09:08   CT Angio Chest PE W and/or Wo Contrast  Result Date: 08/31/2023 CLINICAL DATA:  Pulmonary embolism (PE) suspected, high prob r/o worsening of known PE or pneumonia/abscess. Back pain. EXAM: CT ANGIOGRAPHY CHEST WITH CONTRAST TECHNIQUE: Multidetector CT imaging of the chest was performed using the standard protocol during bolus administration of intravenous contrast. Multiplanar CT image reconstructions and MIPs were obtained to evaluate the vascular anatomy. RADIATION DOSE REDUCTION: This exam was performed according to the departmental dose-optimization program which includes automated exposure control, adjustment of the mA and/or kV according to patient size and/or use of iterative reconstruction technique. CONTRAST:  75mL OMNIPAQUE IOHEXOL 350 MG/ML SOLN COMPARISON:  None Available. FINDINGS: Cardiovascular: No filling defects in the pulmonary arteries to suggest pulmonary emboli. Insert heart is Mediastinum/Nodes: No mediastinal, hilar, or axillary adenopathy. Trachea and esophagus are unremarkable. Thyroid unremarkable. Lungs/Pleura: Nodular ground-glass airspace disease in both lower lobes, left greater than right as well as the left upper lobe. Findings most compatible with multifocal pneumonia. Pleural based masslike density seen in the posterior right lower lobe measuring 2.1 cm. No effusions. Upper Abdomen: No acute findings Musculoskeletal: Chest wall soft tissues  are unremarkable. No acute bony abnormality.  Review of the MIP images confirms the above findings. IMPRESSION: No evidence of pulmonary embolus. Nodular ground-glass airspace disease in the lower lobes and left upper lobe most compatible with multifocal pneumonia. Some of the areas are masslike, particularly a pleural-based area in the posterior right lower lobe measuring up to 2.1 cm. Chest CT is recommended in 3-4 weeks following trial of antibiotic therapy to ensure resolution. Electronically Signed   By: Charlett Nose M.D.   On: 08/31/2023 00:25   DG Chest 2 View  Result Date: 08/30/2023 CLINICAL DATA:  Shortness of breath, cough, back pain EXAM: CHEST - 2 VIEW COMPARISON:  06/26/2019 FINDINGS: The heart size and mediastinal contours are within normal limits. Both lungs are clear. The visualized skeletal structures are unremarkable. IMPRESSION: Normal study. Electronically Signed   By: Charlett Nose M.D.   On: 08/30/2023 21:37     ASSESSMENT & PLAN:  27 y.o. lady with a past medical history of internal hemorrhoids, sickle cell trait, was referred to our service for history of bilateral pulmonary embolism diagnosed on 08/13/2023 in [place].  Bilateral pulmonary embolism (HCC) History of bilateral pulmonary embolism following wisdom tooth extraction. No known risk factors identified. Currently on Eliquis 5 mg twice daily with resolution of clots on recent imaging. Family history of blood clots in distant aunts. -Continue Eliquis 5mg  twice daily for a minimum of six months, possibly extending to one year.  -I reviewed hypercoagulable workup that was done at outside facility in [place] in October 2024: Factor V Leiden mutation, prothrombin gene mutation were both negative.  Protein S antigen and Antithrombin 3 antigen level were both low but these could be falsely low because of acute clot.  Confirmatory testing for lupus anticoagulant was negative.  Anticardiolipin IgM was indeterminate at 15  units/mL.  Anticardiolipin IgG and IgA were undetectable.  Beta-2 glycoprotein antibodies were negative.  -D-dimer was undetectable today.  -We pursued additional hypercoagulable workup for completion today: I repeated anticardiolipin antibody, beta-2 glycoprotein antibody, lupus anticoagulant panel.  We also checked protein C activity, protein S activity, Antithrombin III activity.  Will follow-up on results.  -Plan for a phone call visit in two weeks to discuss test results.  -Schedule follow-up visit in four months (late April 2025) to reassess after completion of six months of anticoagulation therapy.  -If patient were to have recurrence of thromboembolism, she will need indefinite anticoagulation and she verbalized understanding of this fact.  Discussed symptoms to watch for including chest pain, shortness of breath, dizziness, lightheadedness, leg swelling, redness or pain etc.  Healthcare maintenance -Flu vaccine is recommended. -Ensure Pap smear is up to date as part of routine cancer screening.   Orders Placed This Encounter  Procedures   Comprehensive metabolic panel    Standing Status:   Future    Number of Occurrences:   1    Standing Expiration Date:   09/24/2024   CBC with Differential/Platelet    Standing Status:   Future    Number of Occurrences:   1    Standing Expiration Date:   09/24/2024   Beta-2-glycoprotein i abs, IgG/M/A    Standing Status:   Future    Number of Occurrences:   1    Standing Expiration Date:   09/24/2024   Cardiolipin antibodies, IgG, IgM, IgA    Standing Status:   Future    Number of Occurrences:   1    Standing Expiration Date:   09/24/2024   D-dimer, quantitative    Standing  Status:   Future    Number of Occurrences:   1    Standing Expiration Date:   09/24/2024   Protein C activity    Standing Status:   Future    Number of Occurrences:   1    Standing Expiration Date:   09/24/2024   PROTEIN S PANEL    Standing Status:   Future     Number of Occurrences:   1    Standing Expiration Date:   09/24/2024   Protein C, total    Standing Status:   Future    Number of Occurrences:   1    Standing Expiration Date:   09/24/2024   Antithrombin panel    Standing Status:   Future    Number of Occurrences:   1    Standing Expiration Date:   09/24/2024   Lupus anticoagulant panel    Standing Status:   Future    Number of Occurrences:   1    Standing Expiration Date:   09/24/2024    The total time spent in the appointment was 55 minutes encounter with the patient, including review of chart and results of various tests, discussion about plan of care and coordination of care plan.  I reviewed lab results and outside records for this visit and discussed relevant results with the patient. Diagnosis, plan of care and treatment options were also discussed in detail with the patient. Opportunity provided to ask questions and answers provided to her apparent satisfaction. Provided instructions to call our clinic with any problems, questions or concerns prior to return visit. I recommended to continue follow-up with PCP and sub-specialists. She verbalized understanding and agreed with the plan. No barriers to learning was detected.   Future Appointments  Date Time Provider Department Center  09/27/2023  1:30 PM Hilbert Bible, FNP [facility]  11/27/2023  9:00 AM Oretha Milch, MD [facility]     Meryl Crutch, MD  09/25/2023 3:20 PM  [facility] - [facility] DE[facility] [address] [place] [address] Dept: [phone] Dept Fax: [phone]    This document was completed utilizing speech recognition software. Grammatical errors, random word insertions, pronoun errors, and incomplete sentences are an occasional consequence of this system due to software limitations, ambient noise, and hardware issues. Any formal questions or concerns about the content,  text or information contained within the body of this dictation should be directly addressed to the provider for clarification.

## 2023-09-25 NOTE — Progress Notes (Signed)
Hi Kelly Lyons,  Your pneumonia is improving and your PE is resolved as well. I will review this in person at your result as well this week. You should still proceed with your visits with Hematology and Pulmonology as scheduled.

## 2023-09-26 LAB — CARDIOLIPIN ANTIBODIES, IGG, IGM, IGA
Anticardiolipin IgA: <9 [APL'U]/mL (ref 0–11)
Anticardiolipin IgG: 9 [GPL'U]/mL (ref 0–14)
Anticardiolipin IgM: 14 [MPL'U]/mL — ABNORMAL HIGH (ref 0–12)

## 2023-09-26 LAB — PROTEIN S PANEL
Protein S Activity: 68 % (ref 63–140)
Protein S Ag, Free: 92 % (ref 61–136)
Protein S Ag, Total: 85 % (ref 60–150)

## 2023-09-26 LAB — LUPUS ANTICOAGULANT PANEL
DRVVT: 37 s (ref 0.0–47.0)
PTT Lupus Anticoagulant: 34 s (ref 0.0–43.5)

## 2023-09-26 LAB — BETA-2-GLYCOPROTEIN I ABS, IGG/M/A
Beta-2 Glyco I IgG: <9 GPI IgG units (ref 0–20)
Beta-2-Glycoprotein I IgA: <9 GPI IgA units (ref 0–25)
Beta-2-Glycoprotein I IgM: <9 GPI IgM units (ref 0–32)

## 2023-09-26 LAB — PROTEIN C, TOTAL: Protein C, Total: 104 % (ref 60–150)

## 2023-09-26 LAB — PROTEIN C ACTIVITY: Protein C Activity: 101 % (ref 73–180)

## 2023-09-26 LAB — ANTITHROMBIN PANEL
AT III AG PPP IMM-ACNC: 95 % (ref 72–124)
Antithrombin Activity: 138 % — ABNORMAL HIGH (ref 75–135)

## 2023-09-27 ENCOUNTER — Encounter (HOSPITAL_BASED_OUTPATIENT_CLINIC_OR_DEPARTMENT_OTHER): Payer: Self-pay | Admitting: Family Medicine

## 2023-09-27 ENCOUNTER — Encounter (HOSPITAL_BASED_OUTPATIENT_CLINIC_OR_DEPARTMENT_OTHER): Payer: Self-pay

## 2023-09-27 ENCOUNTER — Ambulatory Visit (HOSPITAL_BASED_OUTPATIENT_CLINIC_OR_DEPARTMENT_OTHER): Payer: Medicaid Other | Admitting: Family Medicine

## 2023-09-27 VITALS — BP 119/76 | HR 79 | Temp 99.1°F | Ht 60.25 in | Wt 125.6 lb

## 2023-09-27 DIAGNOSIS — I2699 Other pulmonary embolism without acute cor pulmonale: Secondary | ICD-10-CM

## 2023-09-27 DIAGNOSIS — J189 Pneumonia, unspecified organism: Secondary | ICD-10-CM

## 2023-09-27 DIAGNOSIS — Z23 Encounter for immunization: Secondary | ICD-10-CM

## 2023-09-27 DIAGNOSIS — J188 Other pneumonia, unspecified organism: Secondary | ICD-10-CM

## 2023-09-27 DIAGNOSIS — K921 Melena: Secondary | ICD-10-CM

## 2023-09-27 DIAGNOSIS — K648 Other hemorrhoids: Secondary | ICD-10-CM

## 2023-09-27 MED ORDER — SYMBICORT 80-4.5 MCG/ACT IN AERO: 2.0000 | INHALATION_SPRAY | Freq: Four times a day (QID) | RESPIRATORY_TRACT | 3 refills | Status: DC | PRN

## 2023-09-27 MED ORDER — SERTRALINE HCL 50 MG PO TABS: 50.0000 mg | ORAL_TABLET | Freq: Every day | ORAL | 3 refills | Status: DC

## 2023-09-27 MED ORDER — PRAZOSIN HCL 1 MG PO CAPS: 1.0000 mg | ORAL_CAPSULE | Freq: Every day | ORAL | 3 refills | Status: DC

## 2023-09-27 MED ORDER — TRAZODONE HCL 50 MG PO TABS: 50.0000 mg | ORAL_TABLET | Freq: Every day | ORAL | 3 refills | Status: AC

## 2023-09-27 NOTE — Progress Notes (Addendum)
Subjective:   Zoà Leist 07-Mar-1996 09/27/2023  Chief Complaint  Patient presents with   Follow-up    Follow up from [facility]. Better but now just tired. Using inhaler a lot. Had appts with hematology and had CT done.     HPI: Sibyle Mohammadi presents today for re-assessment and management of bilateral pneumonia and Pe's. 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 She went to ER on Nov 7 and diagnosed with multifocal pneumonia and started on doxycycline. Presenting to PCP on 09/06/23, she was still febrile, having shortness of breath and significant fatigue. She was placed on amoxicillin 1gm TID with doxycycline for multifocal pneumonia and given albuterol inhaler. She was instructed to use Mucinex and antihistamine. Referred to [facility] and Hematology.   Today, patient reports improvement in shortness of breath, cough and fever. She did see Hematology on 09/25/23 and had labwork for full coagulation workup. She has appt with Pulm in February 2025. She had her repeat CT Chest on 09/24/23 which showed improving bibasilar pneumonia without complete resolution. PE had resolved.   She is using albuterol inhaler mostly when active and exerting herself. Most she has used in a day is 4x a day. She gets out of breath easily. Mom is helping ot increase tolerability and activity. Denies fever, cough. She reports she is still having significant fatigue.    The following portions of the patient's history were reviewed and updated as appropriate: past medical history, past surgical history, family history, social history, allergies, medications, and problem list.   Patient Active Problem List   Diagnosis Date Noted   Healthcare maintenance 09/25/2023   Pulmonary embolism and infarction (HCC) 09/06/2023   Internal hemorrhoid 09/06/2023   IUD (intrauterine device) in place 08/16/2023   Bilateral  pulmonary embolism (HCC) 08/13/2023   Sickle cell trait (HCC) 08/16/2019   Indication for care in labor or delivery 08/12/2019   Past Medical History:  Diagnosis Date   Eczema    on face   Pulmonary embolism [facility])    Past Surgical History:  Procedure Laterality Date   SHOULDER ARTHROSCOPY  December   For severely dislocated shoulder   History reviewed. No pertinent family history. Outpatient Medications Prior to Visit  Medication Sig Dispense Refill   albuterol (VENTOLIN HFA) 108 (90 Base) MCG/ACT inhaler Inhale 2 puffs into the lungs every 6 (six) hours as needed for wheezing or shortness of breath. 8 g 2   apixaban (ELIQUIS) 5 MG TABS tablet Take 1 tablet (5 mg total) by mouth 2 (two) times daily. 60 tablet 3   pantoprazole (PROTONIX) 40 MG tablet Take 40 mg by mouth daily.     prazosin (MINIPRESS) 1 MG capsule Take 1 mg by mouth at bedtime.     sertraline (ZOLOFT) 50 MG tablet Take 50 mg by mouth daily.     traZODone (DESYREL) 50 MG tablet Take 50 mg by mouth at bedtime.     acetaminophen (TYLENOL) 325 MG tablet Take 2 tablets (650 mg total) by mouth every 4 (four) hours as needed (for pain scale < 4). (Patient not taking: Reported on 09/27/2023)     oxycodone (OXY-IR) 5 MG capsule Take 5 mg by mouth at bedtime as needed for pain. (Patient not taking: Reported on 09/27/2023)     polyethylene glycol (MIRALAX / GLYCOLAX) 17 g packet Take 17 g by mouth daily as needed for mild constipation. (Patient not taking: Reported  on 09/27/2023)     No facility-administered medications prior to visit.   No Known Allergies   ROS: A complete ROS was performed with pertinent positives/negatives noted in the HPI. The remainder of the ROS are negative.    Objective:   Today's Vitals   09/27/23 1330  BP: 119/76  Pulse: 79  Temp: 99.1 F (37.3 C)  TempSrc: Oral  SpO2: 99%  Weight: 125 lb 9.6 oz (57 kg)  Height: 5' 0.25" (1.53 m)  PainSc: 0-No pain    Physical Exam          GENERAL:  Well-appearing, in NAD. Well nourished.  SKIN: Pink, warm and dry. No rash, lesion, ulceration, or ecchymoses.  Head: Normocephalic. NECK: Trachea midline. Full ROM w/o pain or tenderness. No lymphadenopathy.  RESPIRATORY: Chest wall symmetrical. Respirations even and non-labored at rest. No dyspnea with exertion.  Breath sounds clear to auscultation bilaterally.  CARDIAC: S1, S2 present, regular rate and rhythm without murmur or gallops. Peripheral pulses 2+ bilaterally.  MSK: Muscle tone and strength appropriate for age. Joints w/o tenderness, redness, or swelling.  EXTREMITIES: Without clubbing, cyanosis, or edema.  NEUROLOGIC: No motor or sensory deficits. Steady, even gait. C2-C12 intact.  PSYCH/MENTAL STATUS: Alert, oriented x 3. Cooperative, appropriate mood and affect.   Assessment & Plan:  1. Pulmonary embolism and infarction [facility]) CT negative for PE. She will follow up as scheduled with Hematology for coagulopathy workup for lab results.   2. Multifocal pneumonia Pneumonia improving. Will follow up with Pulmonology as scheduled in February. Recommend continuing antihistamine, albuterol inhaler PRN and start using Symbicort Q6hours PRN due to ongoing mild SHOB with exertion. Recommend starting Vitamin D, improve fluid intake and advance activity as tolerated. If worsening or no improvement, reach out to PCP. Labs reviewed, Improved WBC. Slightly elevated lipids. Will continue to monitor.   3. Encounter for immunization - Flu vaccine trivalent PF, 6mos and older(Flulaval,Afluria,Fluarix,Fluzone)   Meds ordered this encounter  Medications   budesonide-formoterol (SYMBICORT) 80-4.5 MCG/ACT inhaler    Sig: Inhale 2 puffs into the lungs every 6 (six) hours as needed (Shortness of breath).    Dispense:  10.2 g    Refill:  3    Order Specific Question:   Supervising Provider    Answer:   DE [place], RAYMOND J [[id number]]   prazosin (MINIPRESS) 1 MG capsule    Sig: Take 1 capsule (1 mg  total) by mouth at bedtime.    Dispense:  90 capsule    Refill:  3    Order Specific Question:   Supervising Provider    Answer:   DE [place], RAYMOND J [[id number]]   sertraline (ZOLOFT) 50 MG tablet    Sig: Take 1 tablet (50 mg total) by mouth daily.    Dispense:  90 tablet    Refill:  3    Order Specific Question:   Supervising Provider    Answer:   DE [place], RAYMOND J [[id number]]   traZODone (DESYREL) 50 MG tablet    Sig: Take 1 tablet (50 mg total) by mouth at bedtime.    Dispense:  90 tablet    Refill:  3    Order Specific Question:   Supervising Provider    Answer:   DE [place], RAYMOND J [[id number]]   Return in about 4 months (around 01/26/2024) for ANNUAL PHYSICAL.    Patient to reach out to office if new, worrisome, or unresolved symptoms arise or if no improvement in patient's condition. Patient verbalized  understanding and is agreeable to treatment plan. All questions answered to patient's satisfaction.    Hilbert Bible, [place]

## 2023-09-27 NOTE — Patient Instructions (Signed)
Swish and spit after inhaler each use!   Vitamin D3 1000 units daily with Calcium 600-800 units daily Multivitamin

## 2023-10-01 ENCOUNTER — Other Ambulatory Visit (HOSPITAL_BASED_OUTPATIENT_CLINIC_OR_DEPARTMENT_OTHER): Payer: Self-pay | Admitting: Family Medicine

## 2023-10-01 ENCOUNTER — Other Ambulatory Visit (HOSPITAL_BASED_OUTPATIENT_CLINIC_OR_DEPARTMENT_OTHER): Payer: Self-pay

## 2023-10-01 MED ORDER — FLUCONAZOLE 150 MG PO TABS: 150.0000 mg | ORAL_TABLET | Freq: Once | ORAL | 0 refills | Status: AC

## 2023-10-04 ENCOUNTER — Ambulatory Visit (HOSPITAL_BASED_OUTPATIENT_CLINIC_OR_DEPARTMENT_OTHER): Payer: Medicaid Other | Admitting: Family Medicine

## 2023-10-04 ENCOUNTER — Telehealth (HOSPITAL_BASED_OUTPATIENT_CLINIC_OR_DEPARTMENT_OTHER): Payer: Self-pay | Admitting: Family Medicine

## 2023-10-04 ENCOUNTER — Encounter (HOSPITAL_BASED_OUTPATIENT_CLINIC_OR_DEPARTMENT_OTHER): Payer: Self-pay

## 2023-10-04 NOTE — Telephone Encounter (Signed)
Saw that pt was on schedule today 12/12 at 1:10 for pneumococcal vaccine. Stated to Cosby that I was going to move pt to nurse schedule since pt is just wanting the vaccine.  Alexis commented, "Has patient called her insurance company to see if they would cover the vaccine as patient has medicaid? Patient can get the vaccine from the pharmacy with her having medicaid."   Jon Gills also stated that patient needs to wait until after her upcoming appointment with Pulmonary which is scheduled 11/27/23 before she gets the pneumococcal vaccine to make sure that the pneumonia has fully resolved.   Attempted to call pt but unable to reach. Left message for her to return call so we can discuss this information with her.

## 2023-10-04 NOTE — Telephone Encounter (Signed)
Saw that pt's appt was rescheduled with Dr. Everardo All 11/15/23.

## 2023-10-04 NOTE — Telephone Encounter (Signed)
Patient came to the office for the original scheduled appt 12/12 at 1:10. I went out to the lobby to speak with both pt and mother letting them know that we needed her to wait until after her visit with pulmonary prior to her getting a pneumonia vaccine.  Pt's mother was asking if there was any way that pt's appt could be moved up sooner as patient's current appt is not until 11/27/23. Pt is still having problems of coughing from prior pneumonia.   Pt's appt is scheduled with Dr. Vassie Loll. Please advise if there might be any way that pt can have her appt sooner than 2/5 as it has been nearly 2 months since pt's first round of pneumonia.

## 2023-10-05 ENCOUNTER — Other Ambulatory Visit (HOSPITAL_BASED_OUTPATIENT_CLINIC_OR_DEPARTMENT_OTHER): Payer: Self-pay | Admitting: *Deleted

## 2023-10-05 MED ORDER — PANTOPRAZOLE SODIUM 40 MG PO TBEC: 40.0000 mg | DELAYED_RELEASE_TABLET | Freq: Every day | ORAL | 0 refills | Status: DC

## 2023-10-09 ENCOUNTER — Encounter: Payer: Self-pay | Admitting: Oncology

## 2023-10-09 ENCOUNTER — Inpatient Hospital Stay: Payer: Medicaid Other | Admitting: Oncology

## 2023-10-09 DIAGNOSIS — I2699 Other pulmonary embolism without acute cor pulmonale: Secondary | ICD-10-CM

## 2023-10-09 NOTE — Progress Notes (Signed)
[facility]  HEMATOLOGY-ONCOLOGY ELECTRONIC VISIT PROGRESS NOTE  PATIENT NAME: Kelly Lyons   MR#: [id number] DOB: 1996-05-31  DATE OF SERVICE: 10/09/2023  Patient Care Team: Hilbert Bible, FNP as PCP - General (Family Medicine)  I connected with the patient via telephone conference and verified that I am speaking with the correct person using two identifiers. The patient's location is at home and I am providing care from the [facility].  I discussed the limitations, risks, security and privacy concerns of performing an evaluation and management service by e-visits and the availability of in person appointments.  I also discussed with the patient that there may be a patient responsible charge related to this service. The patient expressed understanding and agreed to proceed.   ASSESSMENT & PLAN:   Bilateral pulmonary embolism (HCC) History of bilateral pulmonary embolism following wisdom tooth extraction. No known risk factors identified. Currently on Eliquis 5 mg twice daily with resolution of clots on recent imaging. Family history of blood clots in distant aunts. -Continue Eliquis 5mg  twice daily for a minimum of six months, possibly extending to one year.  I reviewed hypercoagulable workup that was done at outside facility in [place] in October 2024: Factor V Leiden mutation, prothrombin gene mutation were both negative.  Protein S antigen and Antithrombin 3 antigen level were both low but these could be falsely low because of acute clot.  Confirmatory testing for lupus anticoagulant was negative.  Anticardiolipin IgM was indeterminate at 15 units/mL.  Anticardiolipin IgG and IgA were undetectable.  Beta-2 glycoprotein antibodies were negative.   On her initial consultation with [place] on 09/25/2023, D-dimer was undetectable. We pursued additional hypercoagulable workup for completion: I repeated anticardiolipin antibody, beta-2 glycoprotein antibody,  lupus anticoagulant panel.  We also checked protein C activity, protein S activity, Antithrombin III activity.  Anticardiolipin IgM was weakly positive at 14.  Beta-2 glycoprotein antibodies, lupus anticoagulant were negative.  Protein C activity, protein S activity, Antithrombin III activity were all within normal limits.   Schedule follow-up visit in four months (late April 2025) to reassess after completion of six months of anticoagulation therapy.   If patient were to have recurrence of thromboembolism, she will need indefinite anticoagulation and she verbalized understanding of this fact.  Discussed symptoms to watch for including chest pain, shortness of breath, dizziness, lightheadedness, leg swelling, redness or pain etc.   Orders Placed This Encounter  Procedures   Cardiolipin antibodies, IgG, IgM, IgA    Standing Status:   Future    Expiration Date:   10/08/2024   D-dimer, quantitative    Standing Status:   Future    Expected Date:   02/05/2024    Expiration Date:   10/08/2024   CBC with Differential/Platelet    Standing Status:   Future    Expiration Date:   10/08/2024    INTERVAL HISTORY:  Please see above for problem oriented charting.  The purpose of today's discussion is to explain recent lab results and to formulate plan of care.  SUMMARY OF HEMATOLOGY HISTORY:  27 y.o. lady with a past medical history of internal hemorrhoids, sickle cell trait, was referred to our service in December 2024 for history of bilateral pulmonary embolism diagnosed on 08/13/2023 in [place].   Patient reports to establish for primary care provider since moving from [place] after recent hospitalization. Patient states she had a wisdom tooth removal on October 7 and began having right sided chest pain for a week prior. She went  to the ER on October 21. She was also breathless and had significant shortness of breath. She had a history of cigarette, marijuana use and hormonal  contraceptive use (IUD-Mirena). She denied fevers or chills, she was taking her oxycodone per her dentist following surgery without any relief. CT PE angio showed findings consistent with acute bilateral pulmonary thromboembolism with mild right ventricular strain and associated pulmonary infarction in the right lower lobe. Initially patient was hypoxic requiring oxygen via nasal cannula but was able to wean off. She was started on IV heparin and transitioned to Xarelto.  Factor V Leiden mutation, prothrombin gene mutation were both negative.  Protein S antigen and Antithrombin 3 antigen level were both low but these could be falsely low because of acute clot.  Confirmatory testing for lupus anticoagulant was negative.  Anticardiolipin IgM was indeterminate at 15 units/mL.  Anticardiolipin IgG and IgA were undetectable.  Beta-2 glycoprotein antibodies were negative.   USG was negative for DVT in both LEs.   She had a complication of bright red rectal bleeding, placed back on IV heparin, and recommended follow-up with GI for possible internal hemorrhoids upon discharge.    Patient was transitioned to Eliquis on October 28 after resolution of rectal bleeding and was discharged on October 29. She was recommended to follow-up with pulmonology and hematology. Mom denies personal history inpatient of prior DVT, PE or coagulation disorder. Reports history of DVT and PE in maternal and paternal aunts, but no immediate family members.   She presented to [facility] emergency department on November 7 with congestive cough. She was very compliant with her Eliquis. She had mild leukocytosis of 15 with increase in neutrophils, normal CMP, normal protein, mildly prolonged INR at 15.4. Chest x-ray was unremarkable. CTA showed multifocal pneumonia left greater than right with pleural-based masslike density in the posterior right lower lobe measuring 2.1 cm. Chest CT recommended in 3 to 4 weeks following antibiotic therapy  to ensure resolution. Patient was placed on course of doxycycline.    CT chest PE protocol on 08/31/2023 and again on 09/24/2023 showed no evidence of PE now.   The patient reports no prior history of immobility or bed rest before the hospitalization, but was feeling unwell and unable to get up and do much. The patient has never been on oral contraceptives, only an IUD for birth control. The patient has a four-year-old child and had an eventful pregnancy with vaginal bleeding and bed rest a week before delivery at full term.  No history of miscarriages or abortion prior to that.  The patient is a former smoker, having smoked cigars in the past.   I reviewed hypercoagulable workup that was done at outside facility in [place] in October 2024: Factor V Leiden mutation, prothrombin gene mutation were both negative.  Protein S antigen and Antithrombin 3 antigen level were both low but these could be falsely low because of acute clot.  Confirmatory testing for lupus anticoagulant was negative.  Anticardiolipin IgM was indeterminate at 15 units/mL.  Anticardiolipin IgG and IgA were undetectable.  Beta-2 glycoprotein antibodies were negative.   On her initial consultation with [place] on 09/25/2023, D-dimer was undetectable. We pursued additional hypercoagulable workup for completion: I repeated anticardiolipin antibody, beta-2 glycoprotein antibody, lupus anticoagulant panel.  We also checked protein C activity, protein S activity, Antithrombin III activity.  Anticardiolipin IgM was weakly positive at 14.  Beta-2 glycoprotein antibodies, lupus anticoagulant were negative.  Protein C activity, protein S activity, Antithrombin III activity were  all within normal limits.   Schedule follow-up visit in four months (late April 2025) to reassess after completion of six months of anticoagulation therapy.   If patient were to have recurrence of thromboembolism, she will need indefinite anticoagulation and she verbalized  understanding of this fact.  Discussed symptoms to watch for including chest pain, shortness of breath, dizziness, lightheadedness, leg swelling, redness or pain etc.  REVIEW OF SYSTEMS:    Review of Systems - Oncology  All other pertinent systems were reviewed with the patient and are negative.  I have reviewed the past medical history, past surgical history, social history and family history with the patient and they are unchanged from previous note.  ALLERGIES:  She has no known allergies.  MEDICATIONS:  Current Outpatient Medications  Medication Sig Dispense Refill   albuterol (VENTOLIN HFA) 108 (90 Base) MCG/ACT inhaler Inhale 2 puffs into the lungs every 6 (six) hours as needed for wheezing or shortness of breath. 8 g 2   apixaban (ELIQUIS) 5 MG TABS tablet Take 1 tablet (5 mg total) by mouth 2 (two) times daily. 60 tablet 3   budesonide-formoterol (SYMBICORT) 80-4.5 MCG/ACT inhaler Inhale 2 puffs into the lungs every 6 (six) hours as needed (Shortness of breath). 10.2 g 3   pantoprazole (PROTONIX) 40 MG tablet Take 1 tablet (40 mg total) by mouth daily. 30 tablet 0   prazosin (MINIPRESS) 1 MG capsule Take 1 capsule (1 mg total) by mouth at bedtime. 90 capsule 3   sertraline (ZOLOFT) 50 MG tablet Take 1 tablet (50 mg total) by mouth daily. 90 tablet 3   traZODone (DESYREL) 50 MG tablet Take 1 tablet (50 mg total) by mouth at bedtime. 90 tablet 3   No current facility-administered medications for this visit.    PHYSICAL EXAMINATION:  ECOG PERFORMANCE STATUS: 1 - Symptomatic but completely ambulatory  LABORATORY DATA:   I have reviewed the data as listed.  Recent Results (from the past 2160 hours)  CBC with Differential     Status: Abnormal   Collection Time: 08/30/23  8:19 PM  Result Value Ref Range   WBC 15.2 (H) 4.0 - 10.5 K/uL   RBC 4.68 3.87 - 5.11 MIL/uL   Hemoglobin 14.0 12.0 - 15.0 g/dL   HCT 16.1 09.6 - 04.5 %   MCV 83.8 80.0 - 100.0 fL   MCH 29.9 26.0 - 34.0  pg   MCHC 35.7 30.0 - 36.0 g/dL   RDW 40.9 81.1 - 91.4 %   Platelets 507 (H) 150 - 400 K/uL   nRBC 0.0 0.0 - 0.2 %   Neutrophils Relative % 80 %   Neutro Abs 12.1 (H) 1.7 - 7.7 K/uL   Lymphocytes Relative 13 %   Lymphs Abs 1.9 0.7 - 4.0 K/uL   Monocytes Relative 6 %   Monocytes Absolute 1.0 0.1 - 1.0 K/uL   Eosinophils Relative 1 %   Eosinophils Absolute 0.2 0.0 - 0.5 K/uL   Basophils Relative 0 %   Basophils Absolute 0.0 0.0 - 0.1 K/uL   Immature Granulocytes 0 %   Abs Immature Granulocytes 0.06 0.00 - 0.07 K/uL    Comment: Performed at [facility] [place] [facility], [address] [place], [place] [address]  Comprehensive metabolic panel     Status: None   Collection Time: 08/30/23  8:19 PM  Result Value Ref Range   Sodium 135 135 - 145 mmol/L   Potassium 3.7 3.5 - 5.1 mmol/L   Chloride 103 98 -  111 mmol/L   CO2 22 22 - 32 mmol/L   Glucose, Bld 86 70 - 99 mg/dL    Comment: Glucose reference range applies only to samples taken after fasting for at least 8 hours.   BUN 10 6 - 20 mg/dL   Creatinine, Ser 5.28 0.44 - 1.00 mg/dL   Calcium 9.5 8.9 - 41.3 mg/dL   Total Protein 7.6 6.5 - 8.1 g/dL   Albumin 3.5 3.5 - 5.0 g/dL   AST 23 15 - 41 U/L   ALT 27 0 - 44 U/L   Alkaline Phosphatase 63 38 - 126 U/L   Total Bilirubin 0.5 <1.2 mg/dL   GFR, Estimated >24 >40 mL/min    Comment: (NOTE) Calculated using the CKD-EPI Creatinine Equation (2021)    Anion gap 10 5 - 15    Comment: Performed at [facility], [address], [place], [place] [address]  Protime-INR     Status: Abnormal   Collection Time: 08/30/23  8:19 PM  Result Value Ref Range   Prothrombin Time 15.4 (H) 11.4 - 15.2 seconds   INR 1.2 0.8 - 1.2    Comment: (NOTE) INR goal varies based on device and disease states. Performed at [facility], [address] [place], [place] [address]   hCG, serum, qualitative     Status: None   Collection Time: 08/30/23  8:19 PM  Result Value Ref Range   Preg, Serum NEGATIVE  NEGATIVE    Comment:        THE SENSITIVITY OF THIS METHODOLOGY IS >10 mIU/mL. Performed at [facility], [address] [place], [place] [address]   Troponin I (High Sensitivity)     Status: None   Collection Time: 08/30/23  8:19 PM  Result Value Ref Range   Troponin I (High Sensitivity) 3 <18 ng/L    Comment: (NOTE) Elevated high sensitivity troponin I (hsTnI) values and significant  changes across serial measurements may suggest ACS but many other  chronic and acute conditions are known to elevate hsTnI results.  Refer to the "Links" section for chest pain algorithms and additional  guidance. Performed at [facility], [address] [place], [place] [address]   Urinalysis, Routine w reflex microscopic -Urine, Clean Catch     Status: Abnormal   Collection Time: 08/30/23  8:43 PM  Result Value Ref Range   Color, Urine YELLOW YELLOW   APPearance HAZY (A) CLEAR   Specific Gravity, Urine 1.020 1.005 - 1.030   pH 5.0 5.0 - 8.0   Glucose, UA NEGATIVE NEGATIVE mg/dL   Hgb urine dipstick SMALL (A) NEGATIVE   Bilirubin Urine NEGATIVE NEGATIVE   Ketones, ur NEGATIVE NEGATIVE mg/dL   Protein, ur NEGATIVE NEGATIVE mg/dL   Nitrite NEGATIVE NEGATIVE   Leukocytes,Ua NEGATIVE NEGATIVE   RBC / HPF 0-5 0 - 5 RBC/hpf   WBC, UA 0-5 0 - 5 WBC/hpf   Bacteria, UA NONE SEEN NONE SEEN   Squamous Epithelial / HPF 11-20 0 - 5 /HPF   Mucus PRESENT     Comment: Performed at [facility], [address] [place], [place] [address]  Rapid urine drug screen (hospital performed)     Status: Abnormal   Collection Time: 08/30/23  8:43 PM  Result Value Ref Range   Opiates NONE DETECTED NONE DETECTED   Cocaine NONE DETECTED NONE DETECTED   Benzodiazepines NONE DETECTED NONE DETECTED   Amphetamines NONE DETECTED NONE DETECTED  Tetrahydrocannabinol POSITIVE (A) NONE DETECTED   Barbiturates NONE DETECTED NONE DETECTED    Comment: (NOTE) DRUG SCREEN FOR MEDICAL PURPOSES ONLY.  IF  CONFIRMATION IS NEEDED FOR ANY PURPOSE, NOTIFY LAB WITHIN 5 DAYS.  LOWEST DETECTABLE LIMITS FOR URINE DRUG SCREEN Drug Class                     Cutoff (ng/mL) Amphetamine and metabolites    1000 Barbiturate and metabolites    200 Benzodiazepine                 200 Opiates and metabolites        300 Cocaine and metabolites        300 THC                            50 Performed at [facility], [address], [place], [place] [address]   Resp panel by RT-PCR (RSV, Flu A&B, Covid) Anterior Nasal Swab     Status: None   Collection Time: 08/30/23  9:18 PM   Specimen: Anterior Nasal Swab  Result Value Ref Range   SARS Coronavirus 2 by RT PCR NEGATIVE NEGATIVE   Influenza A by PCR NEGATIVE NEGATIVE   Influenza B by PCR NEGATIVE NEGATIVE    Comment: (NOTE) The Xpert Xpress SARS-CoV-2/FLU/RSV plus assay is intended as an aid in the diagnosis of influenza from Nasopharyngeal swab specimens and should not be used as a sole basis for treatment. Nasal washings and aspirates are unacceptable for Xpert Xpress SARS-CoV-2/FLU/RSV testing.  Fact Sheet for Patients: BloggerCourse.com  Fact Sheet for Healthcare Providers: SeriousBroker.it  This test is not yet approved or cleared by the [place] FDA and has been authorized for detection and/or diagnosis of SARS-CoV-2 by FDA under an Emergency Use Authorization (EUA). This EUA will remain in effect (meaning this test can be used) for the duration of the COVID-19 declaration under Section 564(b)(1) of the Act, 21 U.S.C. section 360bbb-3(b)(1), unless the authorization is terminated or revoked.     Resp Syncytial Virus by PCR NEGATIVE NEGATIVE    Comment: (NOTE) Fact Sheet for Patients: BloggerCourse.com  Fact Sheet for Healthcare Providers: SeriousBroker.it  This test is not yet approved or cleared by the [place] FDA  and has been authorized for detection and/or diagnosis of SARS-CoV-2 by FDA under an Emergency Use Authorization (EUA). This EUA will remain in effect (meaning this test can be used) for the duration of the COVID-19 declaration under Section 564(b)(1) of the Act, 21 U.S.C. section 360bbb-3(b)(1), unless the authorization is terminated or revoked.  Performed at [facility], [address] [place], [place] [address]   CBC with Differential/Platelet     Status: None   Collection Time: 09/06/23  2:48 PM  Result Value Ref Range   WBC 8.7 3.4 - 10.8 x10E3/uL   RBC 4.41 3.77 - 5.28 x10E6/uL   Hemoglobin 13.0 11.1 - 15.9 g/dL   Hematocrit 09.8 11.9 - 46.6 %   MCV 88 79 - 97 fL   MCH 29.5 26.6 - 33.0 pg   MCHC 33.3 31.5 - 35.7 g/dL   RDW 14.7 82.9 - 56.2 %   Platelets 419 150 - 450 x10E3/uL   Neutrophils 61 Not Estab. %   Lymphs 29 Not Estab. %   Monocytes 7 Not Estab. %   Eos 2 Not Estab. %   Basos 0 Not Estab. %  Neutrophils Absolute 5.3 1.4 - 7.0 x10E3/uL   Lymphocytes Absolute 2.6 0.7 - 3.1 x10E3/uL   Monocytes Absolute 0.6 0.1 - 0.9 x10E3/uL   EOS (ABSOLUTE) 0.2 0.0 - 0.4 x10E3/uL   Basophils Absolute 0.0 0.0 - 0.2 x10E3/uL   Immature Granulocytes 1 Not Estab. %   Immature Grans (Abs) 0.1 0.0 - 0.1 x10E3/uL  Lipid panel     Status: Abnormal   Collection Time: 09/06/23  2:48 PM  Result Value Ref Range   Cholesterol, Total 204 (H) 100 - 199 mg/dL   Triglycerides 58 0 - 149 mg/dL   HDL 84 >78 mg/dL   VLDL Cholesterol Cal 11 5 - 40 mg/dL   LDL Chol Calc (NIH) 295 (H) 0 - 99 mg/dL   Chol/HDL Ratio 2.4 0.0 - 4.4 ratio    Comment:                                   T. Chol/HDL Ratio                                             Men  Women                               1/2 Avg.Risk  3.4    3.3                                   Avg.Risk  5.0    4.4                                2X Avg.Risk  9.6    7.1                                3X Avg.Risk 23.4   11.0   TSH      Status: None   Collection Time: 09/06/23  2:48 PM  Result Value Ref Range   TSH 1.560 0.450 - 4.500 uIU/mL  Lupus anticoagulant panel     Status: None   Collection Time: 09/25/23  1:54 PM  Result Value Ref Range   PTT Lupus Anticoagulant 34.0 0.0 - 43.5 sec   DRVVT 37.0 0.0 - 47.0 sec   Lupus Anticoag Interp Comment:     Comment: (NOTE) No lupus anticoagulant was detected. Performed At: [facility] [address] [place], [place] [id number] Jolene Schimke MD QI:[id number]   Antithrombin panel     Status: Abnormal   Collection Time: 09/25/23  1:54 PM  Result Value Ref Range   Antithrombin Activity 138 (H) 75 - 135 %    Comment: (NOTE) An elevated antithrombin activity is of no known clinical significance. Direct Xa inhibitor anticoagulants such as rivaroxaban, apixaban and edoxaban will lead to spuriously elevated antithrombin activity levels possibly masking a deficiency.    AT III AG PPP IMM-ACNC 95 72 - 124 %    Comment: (NOTE) This test was developed and its performance characteristics determined by [facility]. It has not been cleared or approved by the Food and Drug Administration. Performed At: [facility] [place] [address] [place], [place] [id number] Jolene Schimke MD WU:[id number]   Protein C, total     Status: None   Collection Time: 09/25/23  1:54 PM  Result Value Ref Range   Protein C, Total 104 60 - 150 %    Comment: (NOTE) Performed At: [facility] [address] [place], [place] [id number] Jolene Schimke MD VH:[id number]   PROTEIN S PANEL     Status: None   Collection Time: 09/25/23  1:54 PM  Result Value Ref Range   Protein S Ag, Total 85 60 - 150 %    Comment: (NOTE) This test was developed and its performance characteristics determined by [facility]. It has not been cleared or approved by the Food and Drug Administration.    Protein S Ag, Free 92 61 - 136 %   Protein S Activity 68 63 - 140 %    Comment: (NOTE) Protein S  activity may be falsely increased (masking an abnormal, low result) in patients receiving direct Xa inhibitor (e.g., rivaroxaban, apixaban, edoxaban) or a direct thrombin inhibitor (e.g., dabigatran) anticoagulant treatment due to assay interference by these drugs. Performed At: [facility] [address] [place], [place] [id number] Jolene Schimke MD UV:[id number]   Protein C activity     Status: None   Collection Time: 09/25/23  1:54 PM  Result Value Ref Range   Protein C Activity 101 73 - 180 %    Comment: (NOTE) Performed At: [facility] [address] [place], [place] [id number] Jolene Schimke MD VF:[id number]   D-dimer, quantitative     Status: None   Collection Time: 09/25/23  1:54 PM  Result Value Ref Range   D-Dimer, Quant <0.27 0.00 - 0.50 ug/mL-FEU    Comment: (NOTE) At the manufacturer cut-off value of 0.5 g/mL FEU, this assay has a negative predictive value of 95-100%.This assay is intended for use in conjunction with a clinical pretest probability (PTP) assessment model to exclude pulmonary embolism (PE) and deep venous thrombosis (DVT) in outpatients suspected of PE or DVT. Results should be correlated with clinical presentation. Performed at [facility], [address] [place], [place] [address]   Cardiolipin antibodies, IgG, IgM, IgA     Status: Abnormal   Collection Time: 09/25/23  1:54 PM  Result Value Ref Range   Anticardiolipin IgG 9 0 - 14 GPL U/mL    Comment: (NOTE)                          Negative:              <15                          Indeterminate:     15 - 20                          Low-Med Positive: >20 - 80                          High Positive:         >80    Anticardiolipin IgM 14 (H) 0 - 12 MPL U/mL    Comment: (NOTE)                          Negative:              <  13                          Indeterminate:     13 - 20                          Low-Med Positive: >20 - 80                           High Positive:         >80    Anticardiolipin IgA <9 0 - 11 APL U/mL    Comment: (NOTE)                          Negative:              <12                          Indeterminate:     12 - 20                          Low-Med Positive: >20 - 80                          High Positive:         >80 Performed At: [facility] [place] [address] [place], [place] [id number] Jolene Schimke MD YS:[id number]   Beta-2-glycoprotein i abs, IgG/M/A     Status: None   Collection Time: 09/25/23  1:54 PM  Result Value Ref Range   Beta-2 Glyco I IgG <9 0 - 20 GPI IgG units    Comment: (NOTE) The reference interval reflects a 3SD or 99th percentile interval, which is thought to represent a potentially clinically significant result in accordance with the International Consensus Statement on the classification criteria for definitive antiphospholipid syndrome (APS). J Thromb Haem 2006;4:295-306.    Beta-2-Glycoprotein I IgM <9 0 - 32 GPI IgM units    Comment: (NOTE) The reference interval reflects a 3SD or 99th percentile interval, which is thought to represent a potentially clinically significant result in accordance with the International Consensus Statement on the classification criteria for definitive antiphospholipid syndrome (APS). J Thromb Haem 2006;4:295-306. Performed At: [facility] [address] [place], [place] [id number] Jolene Schimke MD GU:[id number]    Beta-2-Glycoprotein I IgA <9 0 - 25 GPI IgA units    Comment: (NOTE) The reference interval reflects a 3SD or 99th percentile interval, which is thought to represent a potentially clinically significant result in accordance with the International Consensus Statement on the classification criteria for definitive antiphospholipid syndrome (APS). J Thromb Haem 2006;4:295-306.   CBC with Differential/Platelet     Status: None   Collection Time: 09/25/23  1:54 PM  Result Value Ref Range   WBC 5.0 4.0 - 10.5 K/uL    RBC 4.57 3.87 - 5.11 MIL/uL   Hemoglobin 13.5 12.0 - 15.0 g/dL   HCT 28.3 15.1 - 76.1 %   MCV 83.4 80.0 - 100.0 fL   MCH 29.5 26.0 - 34.0 pg   MCHC 35.4 30.0 - 36.0 g/dL   RDW 60.7 37.1 - 06.2 %   Platelets 284 150 - 400 K/uL   nRBC 0.0 0.0 - 0.2 %   Neutrophils Relative % 38 %   Neutro  Abs 1.9 1.7 - 7.7 K/uL   Lymphocytes Relative 48 %   Lymphs Abs 2.4 0.7 - 4.0 K/uL   Monocytes Relative 9 %   Monocytes Absolute 0.4 0.1 - 1.0 K/uL   Eosinophils Relative 5 %   Eosinophils Absolute 0.3 0.0 - 0.5 K/uL   Basophils Relative 0 %   Basophils Absolute 0.0 0.0 - 0.1 K/uL   Immature Granulocytes 0 %   Abs Immature Granulocytes 0.01 0.00 - 0.07 K/uL    Comment: Performed at [facility], [address] [place], [place] [address]  Comprehensive metabolic panel     Status: None   Collection Time: 09/25/23  1:54 PM  Result Value Ref Range   Sodium 138 135 - 145 mmol/L   Potassium 3.8 3.5 - 5.1 mmol/L   Chloride 104 98 - 111 mmol/L   CO2 26 22 - 32 mmol/L   Glucose, Bld 80 70 - 99 mg/dL    Comment: Glucose reference range applies only to samples taken after fasting for at least 8 hours.   BUN 12 6 - 20 mg/dL   Creatinine, Ser 1.88 0.44 - 1.00 mg/dL   Calcium 9.6 8.9 - 41.6 mg/dL   Total Protein 7.6 6.5 - 8.1 g/dL   Albumin 4.5 3.5 - 5.0 g/dL   AST 17 15 - 41 U/L   ALT 20 0 - 44 U/L   Alkaline Phosphatase 58 38 - 126 U/L   Total Bilirubin 0.7 <1.2 mg/dL   GFR, Estimated >60 >63 mL/min    Comment: (NOTE) Calculated using the CKD-EPI Creatinine Equation (2021)    Anion gap 8 5 - 15    Comment: Performed at [facility], [address], [place], [place] [address]       Latest Ref Rng & Units 09/25/2023    1:54 PM 08/30/2023    8:19 PM 06/26/2019    4:22 PM  CMP  Glucose 70 - 99 mg/dL 80  86  69   BUN 6 - 20 mg/dL 12  10  <5   Creatinine 0.44 - 1.00 mg/dL 0.93  2.35  5.73   Sodium 135 - 145 mmol/L 138  135  135   Potassium 3.5 - 5.1 mmol/L  3.8  3.7  3.5   Chloride 98 - 111 mmol/L 104  103  103   CO2 22 - 32 mmol/L 26  22  18    Calcium 8.9 - 10.3 mg/dL 9.6  9.5  9.4   Total Protein 6.5 - 8.1 g/dL 7.6  7.6  7.5   Total Bilirubin <1.2 mg/dL 0.7  0.5  0.5   Alkaline Phos 38 - 126 U/L 58  63  163   AST 15 - 41 U/L 17  23  20    ALT 0 - 44 U/L 20  27  11      Lab Results  Component Value Date   WBC 5.0 09/25/2023   HGB 13.5 09/25/2023   HCT 38.1 09/25/2023   MCV 83.4 09/25/2023   PLT 284 09/25/2023   NEUTROABS 1.9 09/25/2023     RADIOGRAPHIC STUDIES:  I have personally reviewed the radiological images as listed and agree with the findings in the report.  CT Angio Chest Pulmonary Embolism (PE) W or WO Contrast Result Date: 09/24/2023 CLINICAL DATA:  Chronic shortness of breath, PE with pulmonary infarct. Multifocal pneumonia. EXAM: CT ANGIOGRAPHY CHEST WITH CONTRAST TECHNIQUE: Multidetector CT imaging of the chest was performed using the standard protocol during bolus administration  of intravenous contrast. Multiplanar CT image reconstructions and MIPs were obtained to evaluate the vascular anatomy. RADIATION DOSE REDUCTION: This exam was performed according to the departmental dose-optimization program which includes automated exposure control, adjustment of the mA and/or kV according to patient size and/or use of iterative reconstruction technique. CONTRAST:  75mL ISOVUE-370 IOPAMIDOL (ISOVUE-370) INJECTION 76% COMPARISON:  08/30/2023. FINDINGS: Cardiovascular: Negative for pulmonary embolus. Heart is at the upper limits of normal in size. No pericardial effusion. Mediastinum/Nodes: Thymic tissue in the prevascular space. No pathologically enlarged mediastinal, hilar or axillary lymph nodes. Esophagus is grossly unremarkable. Lungs/Pleura: 3 mm anterior segment right upper lobe nodule (12/70), considered benign in a patient of this age. Improving areas of airspace consolidation in the lower lobes, with residual ground-glass and  subpleural consolidation. No pleural fluid. Airway is unremarkable. Upper Abdomen: Blush of hyperattenuation in the right hepatic lobe (4/139), likely a flash fill hemangioma or perfusion anomaly for which no specific follow-up is necessary. Visualized portions of the liver, adrenal glands, kidneys, spleen, pancreas, stomach and bowel are otherwise grossly unremarkable. No upper abdominal adenopathy. Musculoskeletal: None. Review of the MIP images confirms the above findings. IMPRESSION: 1. Negative for pulmonary embolus. 2. Improving bibasilar pneumonia without complete resolution. Electronically Signed   By: Leanna Battles M.D.   On: 09/24/2023 09:08     I discussed the assessment and treatment plan with the patient. The patient was provided an opportunity to ask questions and all were answered. The patient agreed with the plan and demonstrated an understanding of the instructions. The patient was advised to call back or seek an in-person evaluation if the symptoms worsen or if the condition fails to improve as anticipated.    I spent 20 minutes for the appointment reviewing test results, discuss management and coordination of care.  Meryl Crutch, MD 10/09/2023 5:40 PM [facility] - A DEPT OF [facility] [address] [place] [address] Dept: [phone] Dept Fax: [phone]   Future Appointments  Date Time Provider Department Center  11/15/2023  9:30 AM Luciano Cutter, MD [facility]  01/28/2024  1:50 PM Hilbert Bible, FNP [facility]  02/01/2024  1:00 PM [facility] PHLEBOTOMIST [facility] None  02/19/2024 11:40 AM Maryela Tapper, Archie Patten, MD [facility] None    This document was completed utilizing speech recognition software. Grammatical errors, random word insertions, pronoun errors, and incomplete sentences are an occasional consequence of this system due to software limitations, ambient noise, and hardware  issues. Any formal questions or concerns about the content, text or information contained within the body of this dictation should be directly addressed to the provider for clarification.

## 2023-10-09 NOTE — Assessment & Plan Note (Signed)
History of bilateral pulmonary embolism following wisdom tooth extraction. No known risk factors identified. Currently on Eliquis 5 mg twice daily with resolution of clots on recent imaging. Family history of blood clots in distant aunts. -Continue Eliquis 5mg  twice daily for a minimum of six months, possibly extending to one year.  I reviewed hypercoagulable workup that was done at outside facility in [place] in October 2024: Factor V Leiden mutation, prothrombin gene mutation were both negative.  Protein S antigen and Antithrombin 3 antigen level were both low but these could be falsely low because of acute clot.  Confirmatory testing for lupus anticoagulant was negative.  Anticardiolipin IgM was indeterminate at 15 units/mL.  Anticardiolipin IgG and IgA were undetectable.  Beta-2 glycoprotein antibodies were negative.   On her initial consultation with [place] on 09/25/2023, D-dimer was undetectable. We pursued additional hypercoagulable workup for completion: I repeated anticardiolipin antibody, beta-2 glycoprotein antibody, lupus anticoagulant panel.  We also checked protein C activity, protein S activity, Antithrombin III activity.  Anticardiolipin IgM was weakly positive at 14.  Beta-2 glycoprotein antibodies, lupus anticoagulant were negative.  Protein C activity, protein S activity, Antithrombin III activity were all within normal limits.   Schedule follow-up visit in four months (late April 2025) to reassess after completion of six months of anticoagulation therapy.   If patient were to have recurrence of thromboembolism, she will need indefinite anticoagulation and she verbalized understanding of this fact.  Discussed symptoms to watch for including chest pain, shortness of breath, dizziness, lightheadedness, leg swelling, redness or pain etc.

## 2023-10-11 ENCOUNTER — Encounter (HOSPITAL_BASED_OUTPATIENT_CLINIC_OR_DEPARTMENT_OTHER): Payer: Self-pay | Admitting: Pulmonary Disease

## 2023-10-11 ENCOUNTER — Ambulatory Visit (HOSPITAL_BASED_OUTPATIENT_CLINIC_OR_DEPARTMENT_OTHER): Payer: Medicaid Other | Admitting: Pulmonary Disease

## 2023-10-11 VITALS — BP 108/82 | HR 70 | Resp 16 | Ht 60.03 in | Wt 127.0 lb

## 2023-10-11 DIAGNOSIS — J42 Unspecified chronic bronchitis: Secondary | ICD-10-CM | POA: Diagnosis not present

## 2023-10-11 DIAGNOSIS — I2699 Other pulmonary embolism without acute cor pulmonale: Secondary | ICD-10-CM | POA: Diagnosis not present

## 2023-10-11 DIAGNOSIS — J189 Pneumonia, unspecified organism: Secondary | ICD-10-CM | POA: Diagnosis not present

## 2023-10-11 DIAGNOSIS — Z23 Encounter for immunization: Secondary | ICD-10-CM

## 2023-10-11 NOTE — Patient Instructions (Signed)
Bilateral pulmonary emboli --Eliquis 5 mg BID for 6-12 months --Reviewed CT scan. Presumed area of infarction in right lower lobe is improving  Multifocal pneumonia --Reviewed CT scan. Improving infiltrates in LLL --ORDER pulmonary function test to evaluation chronic lung issues in setting of PE --CONTINUE Symbicort TWO puffs twice a day as needed --Yoga exercises 10 min 1-2 times a day

## 2023-10-11 NOTE — Progress Notes (Signed)
Subjective:   PATIENT ID: [name] GENDER: female DOB: 11-22-1995, MRN: [id number]  Chief Complaint  Patient presents with   Consult    Bilateral pulmonary emboli    Reason for Visit: New consult for bilateral pulmonary emboli  Ms. Kelly Lyons is a 27 year old female with sickle cell trait and bilateral embolism dx 08/13/23 post dental procedure who presents for evaluation for shortness of breath and recent pneumonia.  She was recently seen by Hem/Onc on 10/09/23 for PE following wisdom tooth extraction with no other risk factors identified cxcept for family hx of blood clots in distant aunts. Previously smoker and IUD-Mirena use. Compliant with eliquis 5 mg BID. Has had prior hypercoagulable work-up in [place] in 10/24 including neg fact V, prothrombin, lupus AC, anticardiolipin igM/A/G, beta-2glycoprotein ab, Protein S ag and antithrombin 3 antigen. Reported negative [place] LE. Planned for follow-up with Hem/Onc in 01/2024 after 6 months of therapy.  Last CT PE 08/31/23 with no evidence of PE when she presented  to the ED for pneumonia. Treated with doxycycline. Represented to PCP on 09/06/23 and started on Augmentin for persistent fever, fatigue and shortness of breath. She was referred to Pulmonary. Has since followed up with PCP and shortness of breath improved. Repeat CT Chest 12/2/4 demonstrated improving bilateral pneumonia. Has been using albuterol with activity up to 4x a day. Was started on Symbicort PRN.  She reports her cough has improved. Her right side still has some pain with deep breaths. Occasional wheezing with moderate activity. Still occasionally smoking.  Social History: Smokes MJ occasionally  I have personally reviewed patient's past medical/family/social history, allergies, current medications.  Past Medical History:  Diagnosis Date   Eczema    on face   Pulmonary embolism (HCC)      History reviewed. No pertinent family history.   Social  History   Occupational History   Not on file  Tobacco Use   Smoking status: Former    Types: Cigars    Passive exposure: Past   Smokeless tobacco: Never  Vaping Use   Vaping status: Never Used  Substance and Sexual Activity   Alcohol use: No   Drug use: Yes    Types: Marijuana   Sexual activity: Yes    No Known Allergies   Outpatient Medications Prior to Visit  Medication Sig Dispense Refill   albuterol (VENTOLIN HFA) 108 (90 Base) MCG/ACT inhaler Inhale 2 puffs into the lungs every 6 (six) hours as needed for wheezing or shortness of breath. 8 g 2   apixaban (ELIQUIS) 5 MG TABS tablet Take 1 tablet (5 mg total) by mouth 2 (two) times daily. 60 tablet 3   budesonide-formoterol (SYMBICORT) 80-4.5 MCG/ACT inhaler Inhale 2 puffs into the lungs every 6 (six) hours as needed (Shortness of breath). 10.2 g 3   pantoprazole (PROTONIX) 40 MG tablet Take 1 tablet (40 mg total) by mouth daily. 30 tablet 0   prazosin (MINIPRESS) 1 MG capsule Take 1 capsule (1 mg total) by mouth at bedtime. 90 capsule 3   sertraline (ZOLOFT) 50 MG tablet Take 1 tablet (50 mg total) by mouth daily. 90 tablet 3   traZODone (DESYREL) 50 MG tablet Take 1 tablet (50 mg total) by mouth at bedtime. 90 tablet 3   No facility-administered medications prior to visit.    Review of Systems  Constitutional:  Negative for chills, diaphoresis, fever, malaise/fatigue and weight loss.  HENT:  Negative for congestion.   Respiratory:  Positive for cough.  Negative for hemoptysis, sputum production, shortness of breath and wheezing.   Cardiovascular:  Negative for chest pain, palpitations and leg swelling.     Objective:   Vitals:   10/11/23 1303  BP: 108/82  Pulse: 70  Resp: 16  SpO2: 98%  Weight: 127 lb (57.6 kg)  Height: 5' 0.03" (1.525 m)   SpO2: 98 %  Physical Exam: General: Well-appearing, no acute distress HENT: [place], AT Eyes: EOMI, no scleral icterus Respiratory: Clear to auscultation bilaterally.  No  crackles, wheezing or rales Cardiovascular: RRR, -M/R/G, no JVD Extremities:-Edema,-tenderness Neuro: AAO x4, CNII-XII grossly intact Psych: Normal mood, normal affect  Data Reviewed:  Imaging: CTA 08/30/23 - RLL infarct and LLL infiltrates CTA 09/24/23 - Improved LLL infiltrates and RLL infarction  PFT: None on file  Labs: CBC    Component Value Date/Time   WBC 5.0 09/25/2023 1354   RBC 4.57 09/25/2023 1354   HGB 13.5 09/25/2023 1354   HGB 13.0 09/06/2023 1448   HCT 38.1 09/25/2023 1354   HCT 39.0 09/06/2023 1448   PLT 284 09/25/2023 1354   PLT 419 09/06/2023 1448   MCV 83.4 09/25/2023 1354   MCV 88 09/06/2023 1448   MCH 29.5 09/25/2023 1354   MCHC 35.4 09/25/2023 1354   RDW 14.0 09/25/2023 1354   RDW 12.6 09/06/2023 1448   LYMPHSABS 2.4 09/25/2023 1354   LYMPHSABS 2.6 09/06/2023 1448   MONOABS 0.4 09/25/2023 1354   EOSABS 0.3 09/25/2023 1354   EOSABS 0.2 09/06/2023 1448   BASOSABS 0.0 09/25/2023 1354   BASOSABS 0.0 09/06/2023 1448        Assessment & Plan:   Discussion: 27 year old female with sickle cell trait and bilateral embolism dx 08/13/23 post dental procedure who presents for evaluation for shortness of breath and recent pneumonia. Reviewed history including recent PE and pneumonia. Improved respiratory symptoms. CT imaging with improving pulmonary infarct and pneumonia. Recommend PRN bronchodilators and yoga exercises. Will evaluate with PFTs in the future to determine any chronic lung diseases related to her condition in the future. Suspect that she will recover from this. No indication to repeat CT unless symptoms worsen. Expected course of infarct and pneumonia to continue to improve.    Bilateral pulmonary emboli --Eliquis 5 mg BID for 6-12 months --Reviewed CT scan. Presumed area of infarction in right lower lobe is improving  Multifocal pneumonia --Reviewed CT scan. Improving infiltrates in LLL --ORDER pulmonary function test to evaluation  chronic lung issues in setting of PE --CONTINUE Symbicort TWO puffs twice a day as needed --Yoga exercises 10 min 1-2 times a day  Health Maintenance Immunization History  Administered Date(s) Administered   Influenza, Seasonal, Injecte, Preservative Fre 09/27/2023   PNEUMOCOCCAL CONJUGATE-20 10/11/2023   CT Lung Screen - not qualified  Orders Placed This Encounter  Procedures   Pneumococcal conjugate vaccine 20-valent   Pulmonary function test    Standing Status:   Future    Expiration Date:   10/10/2024    Where should this test be performed?:   [facility]    Full PFT: includes the following: basic spirometry, spirometry pre & post bronchodilator, diffusion capacity (DLCO), lung volumes:   Full PFT  No orders of the defined types were placed in this encounter.   Return for after PFT late Jan or Feb.  I have spent a total time of 45-minutes on the day of the appointment reviewing prior documentation, coordinating care and discussing medical diagnosis and plan with the patient/family. Imaging, labs  and tests included in this note have been reviewed and interpreted independently by me.  Tarisa Paola Mechele Collin, MD [facility] 10/11/2023 4:34 PM

## 2023-10-12 NOTE — Progress Notes (Signed)
Please disregard

## 2023-11-01 ENCOUNTER — Other Ambulatory Visit (HOSPITAL_BASED_OUTPATIENT_CLINIC_OR_DEPARTMENT_OTHER): Payer: Self-pay | Admitting: Family Medicine

## 2023-11-15 ENCOUNTER — Institutional Professional Consult (permissible substitution) (HOSPITAL_BASED_OUTPATIENT_CLINIC_OR_DEPARTMENT_OTHER): Payer: Medicaid Other | Admitting: Pulmonary Disease

## 2023-11-19 ENCOUNTER — Encounter (HOSPITAL_BASED_OUTPATIENT_CLINIC_OR_DEPARTMENT_OTHER): Payer: Self-pay | Admitting: Family Medicine

## 2023-11-19 ENCOUNTER — Encounter: Payer: Self-pay | Admitting: Gastroenterology

## 2023-11-19 NOTE — Telephone Encounter (Signed)
Jon Gills, please see mychart message sent by pt and advise.

## 2023-11-20 ENCOUNTER — Encounter (HOSPITAL_BASED_OUTPATIENT_CLINIC_OR_DEPARTMENT_OTHER): Payer: Self-pay | Admitting: *Deleted

## 2023-11-20 NOTE — Telephone Encounter (Signed)
Looked to see when we could get pt scheduled for an appt to have the vaginal discharge addressed. First opening without using a same day slot is not until 2/12.  Alexis, please advise if you would be okay with [place] placing her in one of the same day slots since she does want to try to get this checked as soon as we can get her in?

## 2023-11-21 NOTE — Progress Notes (Unsigned)
Office Visit Note   Patient: Kelly Lyons           Date of Birth: 10/15/1996           MRN: [id number] Visit Date: 11/22/2023              Requested by: Kelly Bible, FNP [address] [place],  [place] [address] PCP: Kelly Bible, FNP   Assessment & Plan: Visit Diagnoses:  1. Chronic right shoulder pain     Plan: Kelly Lyons is a 28 year old female with right shoulder pain for about 2 weeks.  Impression is scapular and trapezial trigger points.  Recommend acupuncture, cupping, outpatient physical therapy and dry needling.  I made referral to outpatient PT.  Follow-up as needed.  Follow-Up Instructions: No follow-ups on file.   Orders:  Orders Placed This Encounter  Procedures  . XR Shoulder Right  . Ambulatory referral to Physical Therapy   No orders of the defined types were placed in this encounter.     Procedures: No procedures performed   Clinical Data: No additional findings.   Subjective: Chief Complaint  Patient presents with  . Right Shoulder - Pain    HPI Kelly Lyons is a 28 year old female here for evaluation of right shoulder pain for 2 weeks.  This is constant.  She feels pain around the trapezius and scapular areas.  She is status post right shoulder surgery in 2013 and reportedly she had a torn rotator cuff from shoulder dislocation.  She states she has pain when reaching up and is worse at the end of the day.  She is on Eliquis for history of bilateral PEs. Review of Systems  Constitutional: Negative.   HENT: Negative.    Eyes: Negative.   Respiratory: Negative.    Cardiovascular: Negative.   Endocrine: Negative.   Musculoskeletal: Negative.   Neurological: Negative.   Hematological: Negative.   Psychiatric/Behavioral: Negative.    All other systems reviewed and are negative.    Objective: Vital Signs: There were no vitals taken for this visit.  Physical Exam Vitals and nursing note reviewed.   Constitutional:      Appearance: She is well-developed.  HENT:     Head: Atraumatic.     Nose: Nose normal.  Eyes:     Extraocular Movements: Extraocular movements intact.  Cardiovascular:     Pulses: Normal pulses.  Pulmonary:     Effort: Pulmonary effort is normal.  Abdominal:     Palpations: Abdomen is soft.  Musculoskeletal:     Cervical back: Neck supple.  Skin:    General: Skin is warm.     Capillary Refill: Capillary refill takes less than 2 seconds.  Neurological:     Mental Status: She is alert. Mental status is at baseline.  Psychiatric:        Behavior: Behavior normal.        Thought Content: Thought content normal.        Judgment: Judgment normal.    Ortho Exam Examination of the right shoulder shows normal active and passive range of motion.  Normal rotator cuff strength to manual muscle testing.  No impingement signs.  Negative apprehension.  Negative sulcus sign.  Negative O'Brien's sign. Specialty Comments:  No specialty comments available.  Imaging: XR Shoulder Right Result Date: 11/22/2023 3 view xrays show no acute or structural abnormalities    PMFS History: Patient Active Problem List   Diagnosis Date Noted  . Healthcare maintenance 09/25/2023  . Pulmonary embolism  and infarction (HCC) 09/06/2023  . Internal hemorrhoid 09/06/2023  . IUD (intrauterine device) in place 08/16/2023  . Bilateral pulmonary embolism (HCC) 08/13/2023  . Sickle cell trait (HCC) 08/16/2019  . Indication for care in labor or delivery 08/12/2019   Past Medical History:  Diagnosis Date  . Eczema    on face  . Pulmonary embolism (HCC)     No family history on file.  Past Surgical History:  Procedure Laterality Date  . SHOULDER ARTHROSCOPY  December   For severely dislocated shoulder   Social History   Occupational History  . Not on file  Tobacco Use  . Smoking status: Former    Types: Cigars    Passive exposure: Past  . Smokeless tobacco: Never  Vaping  Use  . Vaping status: Never Used  Substance and Sexual Activity  . Alcohol use: No  . Drug use: Yes    Types: Marijuana  . Sexual activity: Yes

## 2023-11-22 ENCOUNTER — Encounter: Payer: Self-pay | Admitting: Orthopaedic Surgery

## 2023-11-22 ENCOUNTER — Other Ambulatory Visit (INDEPENDENT_AMBULATORY_CARE_PROVIDER_SITE_OTHER): Payer: Medicaid Other

## 2023-11-22 ENCOUNTER — Ambulatory Visit: Payer: Medicaid Other | Admitting: Orthopaedic Surgery

## 2023-11-22 DIAGNOSIS — M25511 Pain in right shoulder: Secondary | ICD-10-CM | POA: Diagnosis not present

## 2023-11-22 DIAGNOSIS — G8929 Other chronic pain: Secondary | ICD-10-CM

## 2023-11-23 ENCOUNTER — Encounter (HOSPITAL_BASED_OUTPATIENT_CLINIC_OR_DEPARTMENT_OTHER): Payer: Self-pay | Admitting: Family Medicine

## 2023-11-23 ENCOUNTER — Other Ambulatory Visit (HOSPITAL_COMMUNITY)
Admission: RE | Admit: 2023-11-23 | Discharge: 2023-11-23 | Disposition: A | Discharge status: Home / Self Care | Payer: Medicaid Other | Source: Ambulatory Visit | Attending: Family Medicine | Admitting: Family Medicine

## 2023-11-23 ENCOUNTER — Ambulatory Visit (INDEPENDENT_AMBULATORY_CARE_PROVIDER_SITE_OTHER): Payer: Medicaid Other | Admitting: Family Medicine

## 2023-11-23 VITALS — BP 105/58 | HR 62 | Ht 60.0 in | Wt 140.1 lb

## 2023-11-23 DIAGNOSIS — N898 Other specified noninflammatory disorders of vagina: Secondary | ICD-10-CM | POA: Insufficient documentation

## 2023-11-23 DIAGNOSIS — G8929 Other chronic pain: Secondary | ICD-10-CM | POA: Diagnosis not present

## 2023-11-23 DIAGNOSIS — M25511 Pain in right shoulder: Secondary | ICD-10-CM | POA: Diagnosis not present

## 2023-11-23 MED ORDER — FLUCONAZOLE 150 MG PO TABS: 150.0000 mg | ORAL_TABLET | Freq: Once | ORAL | 0 refills | Status: AC

## 2023-11-23 MED ORDER — CYCLOBENZAPRINE HCL 10 MG PO TABS: 10.0000 mg | ORAL_TABLET | Freq: Three times a day (TID) | ORAL | 1 refills | Status: DC | PRN

## 2023-11-23 NOTE — Progress Notes (Signed)
Acute Care Office Visit  Subjective:   Kelly Lyons [birth year] 11/23/2023  Chief Complaint  Patient presents with   Vaginal Discharge    Patient has been having a lot of vaginal discharge for the past few weeks. Denies any burning with urination.    HPI: VAGINAL DISCHARGE: Onset: Several weeks    Description: Patient reports increase in vaginal discharge over the past several weeks.  She was previously on antibiotics and treated for possible yeast infection in November and December of the past year.  Patient denies odor, itching, dysuria, vaginal bleeding, pelvic or abdominal pain.  She denies any concern for possible STD testing, rashes or sores.  Patient's last menstrual period was 11/01/2023 (exact date).    CHRONIC RIGHT SHOULDER PAIN:  Patient reports seeing orthopedics yesterday for chronic right shoulder pain ongoing for about 2 weeks.  She was recommended trigger point therapy, cupping and outpatient PT.  She has this scheduled in approximately 2 weeks.  She is wondering if she could obtain medication to help with muscle spasms and pain of the right shoulder that is impairing activity and sleep.Unable to take NSAIDS due to being on anticoagulant w/ recent PE.     The following portions of the patient's history were reviewed and updated as appropriate: past medical history, past surgical history, family history, social history, allergies, medications, and problem list.   Patient Active Problem List   Diagnosis Date Noted   Chronic right shoulder pain 11/23/2023   Healthcare maintenance 09/25/2023   Pulmonary embolism and infarction (HCC) 09/06/2023   Internal hemorrhoid 09/06/2023   IUD (intrauterine device) in place 08/16/2023   Bilateral pulmonary embolism (HCC) 08/13/2023   Sickle cell trait (HCC) 08/16/2019   Indication for care in labor or delivery 08/12/2019   Past Medical History:  Diagnosis Date   Eczema    on face   Pulmonary embolism [facility])     Past Surgical History:  Procedure Laterality Date   SHOULDER ARTHROSCOPY  December   For severely dislocated shoulder   History reviewed. No pertinent family history. Outpatient Medications Prior to Visit  Medication Sig Dispense Refill   albuterol (VENTOLIN HFA) 108 (90 Base) MCG/ACT inhaler Inhale 2 puffs into the lungs every 6 (six) hours as needed for wheezing or shortness of breath. 8 g 2   apixaban (ELIQUIS) 5 MG TABS tablet Take 1 tablet (5 mg total) by mouth 2 (two) times daily. 60 tablet 3   budesonide-formoterol (SYMBICORT) 80-4.5 MCG/ACT inhaler Inhale 2 puffs into the lungs every 6 (six) hours as needed (Shortness of breath). 10.2 g 3   pantoprazole (PROTONIX) 40 MG tablet TAKE 1 TABLET BY MOUTH EVERY DAY 30 tablet 0   prazosin (MINIPRESS) 1 MG capsule Take 1 capsule (1 mg total) by mouth at bedtime. 90 capsule 3   sertraline (ZOLOFT) 50 MG tablet Take 1 tablet (50 mg total) by mouth daily. 90 tablet 3   traZODone (DESYREL) 50 MG tablet Take 1 tablet (50 mg total) by mouth at bedtime. 90 tablet 3   No facility-administered medications prior to visit.   No Known Allergies   ROS: A complete ROS was performed with pertinent positives/negatives noted in the HPI. The remainder of the ROS are negative.    Objective:   Today's Vitals   11/23/23 1110  BP: (!) 105/58  Pulse: 62  SpO2: 100%  Weight: 140 lb 1.6 oz (63.5 kg)  Height: 5' (1.524 m)    GENERAL: Well-appearing, in NAD.  Well nourished.  SKIN: Pink, warm and dry. No rash, lesion, ulceration, or ecchymoses.  Head: Normocephalic. NECK: Trachea midline. Full ROM w/o pain or tenderness. RESPIRATORY: Chest wall symmetrical. Respirations even and non-labored.  EXTREMITIES: Without clubbing, cyanosis, or edema.  NEUROLOGIC: No motor or sensory deficits. Steady, even gait. C2-C12 intact.  PSYCH/MENTAL STATUS: Alert, oriented x 3. Cooperative, appropriate mood and affect.      Assessment & Plan:  1. Vaginal  discharge (Primary) Will treat for possible yeast infection with Diflucan 150 mg once.  Patient may repeat after 3 days if symptoms are not improved.  Patient performed self swab for possible BV and yeast and will be notified of results and treated appropriately. - fluconazole (DIFLUCAN) 150 MG tablet; Take 1 tablet (150 mg total) by mouth once for 1 dose. May repeat after 3 days if needed.  Dispense: 2 tablet; Refill: 0 - Cervicovaginal ancillary only  2. Chronic right shoulder pain Recommend patient use Tylenol 500 mg twice daily for pain and also sent in Flexeril for muscle spasms.  Safe use of this medication reviewed with patient. - cyclobenzaprine (FLEXERIL) 10 MG tablet; Take 1 tablet (10 mg total) by mouth 3 (three) times daily as needed for muscle spasms.  Dispense: 30 tablet; Refill: 1   Meds ordered this encounter  Medications   fluconazole (DIFLUCAN) 150 MG tablet    Sig: Take 1 tablet (150 mg total) by mouth once for 1 dose. May repeat after 3 days if needed.    Dispense:  2 tablet    Refill:  0    Supervising Provider:   DE Peru, RAYMOND J [[id number]]   cyclobenzaprine (FLEXERIL) 10 MG tablet    Sig: Take 1 tablet (10 mg total) by mouth 3 (three) times daily as needed for muscle spasms.    Dispense:  30 tablet    Refill:  1    Supervising Provider:   DE [place], RAYMOND J [[id number]]   Return if symptoms worsen or fail to improve.    Patient to reach out to office if new, worrisome, or unresolved symptoms arise or if no improvement in patient's condition. Patient verbalized understanding and is agreeable to treatment plan. All questions answered to patient's satisfaction.    Hilbert Bible, [place]

## 2023-11-26 ENCOUNTER — Encounter (HOSPITAL_BASED_OUTPATIENT_CLINIC_OR_DEPARTMENT_OTHER): Payer: Self-pay | Admitting: Family Medicine

## 2023-11-26 ENCOUNTER — Other Ambulatory Visit (HOSPITAL_BASED_OUTPATIENT_CLINIC_OR_DEPARTMENT_OTHER): Payer: Self-pay | Admitting: Family Medicine

## 2023-11-26 DIAGNOSIS — B9689 Other specified bacterial agents as the cause of diseases classified elsewhere: Secondary | ICD-10-CM

## 2023-11-26 LAB — CERVICOVAGINAL ANCILLARY ONLY
Bacterial Vaginitis (gardnerella): POSITIVE — AB
Candida Glabrata: NEGATIVE
Candida Vaginitis: NEGATIVE
Comment: NEGATIVE
Comment: NEGATIVE
Comment: NEGATIVE

## 2023-11-26 MED ORDER — METRONIDAZOLE 500 MG PO TABS: 500.0000 mg | ORAL_TABLET | Freq: Two times a day (BID) | ORAL | 0 refills | Status: DC

## 2023-11-26 NOTE — Progress Notes (Signed)
Mane,  You have an overgrowth of bacteria in your vagina called Bacterial Vaginosis. I have sent in Metronidazole for you to take orally for 7 days. This is not a sexually transmitted infection.  Sexual partners do not need to be treated, however abstaining from sex or using condoms may prevent recurrence of the overgrowth. Some women have a recurrence of the overgrowth even when fully treated.  Call the office if your symptoms begin again.  Do not douche.  This is associated with decreased cure rates and more bacterial overgrowths. Take the full course of the antibiotic prescribed to you even if you begin to feel better. Do not drink alcohol with the antibiotic flagyl (metronidazole) as this drug will cause nausea and severe vomiting if you drink while taking antibiotic.

## 2023-11-27 ENCOUNTER — Institutional Professional Consult (permissible substitution) (HOSPITAL_BASED_OUTPATIENT_CLINIC_OR_DEPARTMENT_OTHER): Payer: Medicaid Other | Admitting: Pulmonary Disease

## 2023-12-01 ENCOUNTER — Other Ambulatory Visit (HOSPITAL_BASED_OUTPATIENT_CLINIC_OR_DEPARTMENT_OTHER): Payer: Self-pay | Admitting: Family Medicine

## 2023-12-04 ENCOUNTER — Other Ambulatory Visit: Payer: Self-pay

## 2023-12-04 ENCOUNTER — Encounter: Payer: Self-pay | Admitting: Physical Therapy

## 2023-12-04 ENCOUNTER — Ambulatory Visit: Payer: Medicaid Other | Attending: Orthopaedic Surgery | Admitting: Physical Therapy

## 2023-12-04 DIAGNOSIS — M6281 Muscle weakness (generalized): Secondary | ICD-10-CM | POA: Insufficient documentation

## 2023-12-04 DIAGNOSIS — G8929 Other chronic pain: Secondary | ICD-10-CM | POA: Insufficient documentation

## 2023-12-04 DIAGNOSIS — M25511 Pain in right shoulder: Secondary | ICD-10-CM | POA: Diagnosis present

## 2023-12-04 NOTE — Therapy (Signed)
OUTPATIENT PHYSICAL THERAPY SHOULDER EVALUATION   Patient Name: Kelly Lyons MRN: [id number] DOB:09/10/1996, 28 y.o., female Today's Date: 12/04/2023   PT End of Session - 12/04/23 1434     Visit Number 1    Number of Visits --   1-2x/week   Date for PT Re-Evaluation 01/29/24    Authorization Type MCD - [employer]    PT Start Time 1130    PT Stop Time 1210    PT Time Calculation (min) 40 min             Past Medical History:  Diagnosis Date   Eczema    on face   Pulmonary embolism (HCC)    Past Surgical History:  Procedure Laterality Date   SHOULDER ARTHROSCOPY  December   For severely dislocated shoulder   Patient Active Problem List   Diagnosis Date Noted   Chronic right shoulder pain 11/23/2023   Healthcare maintenance 09/25/2023   Pulmonary embolism and infarction (HCC) 09/06/2023   Internal hemorrhoid 09/06/2023   IUD (intrauterine device) in place 08/16/2023   Bilateral pulmonary embolism (HCC) 08/13/2023   Sickle cell trait (HCC) 08/16/2019   Indication for care in labor or delivery 08/12/2019    PCP: Hilbert Bible, FNP  REFERRING PROVIDER: Tarry Kos, MD  THERAPY DIAG:  Right shoulder pain, unspecified chronicity - Plan: PT plan of care cert/re-cert  Muscle weakness - Plan: PT plan of care cert/re-cert  REFERRING DIAG: Chronic right shoulder pain [M25.511, G89.29]   Rationale for Evaluation and Treatment:  Rehabilitation  SUBJECTIVE:  PERTINENT PAST HISTORY:  Hx of PE, previous R shoulder dislocation, on blood thinner       PRECAUTIONS: None  WEIGHT BEARING RESTRICTIONS No  FALLS:  Has patient fallen in last 6 months? No, Number of falls: 0  MOI/History of condition:  Onset date: Acute on chronic (for multiple years), with increased spasm over the last 3-4 weeks  SUBJECTIVE STATEMENT  Kelly Lyons is a 28 y.o. female who presents to clinic with chief complaint of R shoulder pain.  She had chronic dislocations a  few years ago.  After several dislocations, she had an arthroscopic surgery to to reduce the dislocations (she does not remember the name specifically) and has had some level of pain since.  She has had no subsequent dislocations.  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   Red flags:  denies   Pain:  Are you having pain? Yes Pain location: R UT and shoulder blade NPRS scale:  3/10 to 9/10 Aggravating factors: slowly worse throughout the day Relieving factors: muscle relaxer, massage, heat Pain description:  tight, aching Stage: Subacute 24 hour pattern: better in the morning and gets worse througout the day   Occupation: NA  Assistive Device: NA  Hand Dominance: R  Patient Goals/Specific Activities: reduce pain, get back to walking   OBJECTIVE:   DIAGNOSTIC FINDINGS:  X-rays clear   GENERAL OBSERVATION: WNL     SENSATION: Light touch: Appears intact   PALPATION: Significant TTP and trigger point R UT and R LS  UPPER EXTREMITY AROM:  ROM Right (Eval) Left (Eval)  Shoulder flexion N N  Shoulder abduction N N  Shoulder internal rotation    Shoulder external rotation    Functional IR N N  Functional ER N N  Shoulder extension    Elbow extension    Elbow flexion     (  Blank rows = not tested, N = WNL, * = concordant pain with testing)  UPPER EXTREMITY MMT:  MMT Right (Eval) Left (Eval)  Shoulder flexion WNL WNL  Shoulder abduction (C5) WNL WNL  Shoulder ER WNL WNL  Shoulder IR WNL WNL  Middle trapezius    Lower trapezius    Shoulder extension    Grip strength    Cervical flexion (C1,C2)    Cervical S/B (C3)    Shoulder shrug (C4)    Elbow flexion (C6)    Elbow ext (C7)    Thumb ext (C8)    Finger abd (T1)    Grossly     (Blank rows = not tested, score listed is out of 5 possible points.  N = WNL, D = diminished, C = clear for gross weakness with myotome  testing, * = concordant pain with testing)   UPPER EXTREMITY PROM:  PROM Right (Eval) Left (Eval)  Shoulder flexion    Shoulder abduction    Shoulder internal rotation    Shoulder external rotation    Functional IR    Functional ER    Shoulder extension    Elbow extension    Elbow flexion     (Blank rows = not tested, N = WNL, * = concordant pain with testing)  PATIENT SURVEYS:  QuickDASH Score: 31.8 / 100 = 31.8 %    TODAY'S TREATMENT:   [place]/HM: discussion of condition and management as well as time frame to expect change.  Created and reviewed below HEP.  Manual therapy: Skilled palpation to identify trigger points for TDN STM to all listed muscles following TDN  Trigger Point Dry Needling  Initial Treatment: Pt instructed on Dry Needling rational, procedures, and possible side effects. Pt instructed to expect mild to moderate muscle soreness later in the day and/or into the next day.  Pt instructed in methods to reduce muscle soreness. Because Dry Needling was performed over or adjacent to a lung field, pt was educated on S/S of pneumothorax and to seek immediate medical attention should they occur.  Patient was educated on signs and symptoms of infection and other risk factors and advised to seek medical attention should they occur.  Patient verbalized understanding of these instructions and education.   Patient Verbal Consent Given: Yes Education Handout Provided: Yes Muscles Treated: R UT Electrical Stimulation Performed: No Treatment Response/Outcome: twitch     PATIENT EDUCATION:  POC, diagnosis, prognosis, HEP, and outcome measures.  Pt educated via explanation, demonstration, and handout (HEP).  Pt confirms understanding verbally.   HOME EXERCISE PROGRAM: Access Code: [email or web address] URL: [email or web address] Date: 12/04/2023 Prepared by: Alphonzo Severance  Exercises - Seated Upper Trapezius Stretch  - 1 x daily - 7 x weekly - 3 reps - 45  second hold - Seated Levator Scapulae Stretch  - 1 x daily - 7 x weekly - 3 reps - 45 second hold  Treatment priorities   Eval                                                  ASSESSMENT:  CLINICAL IMPRESSION: [name] is a 28 y.o. female who presents to clinic with signs and sxs consistent with R shoulder pain.  Strength and ROM WNL.  X-ray (-).  She does have a history of dislocations that was addressed surgically years ago.  This appears  myofacial in nature, mainly stemming from the R UT and R LS.  She responded well to TDN and stretching today.  Marshella will benefit from skilled PT to address relevant deficits and improve comfort with daily tasks including taking care of her daughter.    OBJECTIVE IMPAIRMENTS: Pain  ACTIVITY LIMITATIONS: recreation, housework, OH reaching, carrying, some more vigorous tasks involving her daughter  PERSONAL FACTORS: See medical history and pertinent history   REHAB POTENTIAL: Good  CLINICAL DECISION MAKING: Evolving/moderate complexity  EVALUATION COMPLEXITY: Moderate   GOALS:   SHORT TERM GOALS: Target date: 01/01/2024   Makenly will be >75% HEP compliant to improve carryover between sessions and facilitate independent management of condition  Evaluation: ongoing Goal status: INITIAL   LONG TERM GOALS: Target date: 01/29/2024   Marlyss will self report >/= 50% decrease in pain from evaluation to improve function in daily tasks  Evaluation/Baseline: 9/10 max pain Goal status: INITIAL   2.  Lucciana will show a >/= 7.5 pt improvement in her QUICK DASH score (MCID is 10% or ~5 pts) as a proxy for functional improvement   Evaluation/Baseline: QuickDASH Score: 31.8 / 100 = 31.8 % Goal status: INITIAL   3.  Nyashia will report confidence in self management of condition at time of discharge with advanced HEP  Evaluation/Baseline: unable to self manage Goal status: INITIAL    PLAN: PT FREQUENCY: 1-2x/week  PT  DURATION: 8 weeks  PLANNED INTERVENTIONS:  97164- PT Re-evaluation, 97110-Therapeutic exercises, 97530- Therapeutic activity, 97112- Neuromuscular re-education, 97535- Self Care, 56213- Manual therapy, [id number]- Gait training, [id number]- Aquatic Therapy, [id number]- Electrical stimulation (manual), [id number]- Vasopneumatic device, [id number]- Traction (mechanical), [id number]- Ionotophoresis 4mg /ml Dexamethasone, Taping, Dry Needling, Joint manipulation, and Spinal manipulation.   Alphonzo Severance PT, DPT 12/04/2023, 3:03 PM

## 2023-12-04 NOTE — Patient Instructions (Signed)

## 2023-12-06 ENCOUNTER — Other Ambulatory Visit (HOSPITAL_BASED_OUTPATIENT_CLINIC_OR_DEPARTMENT_OTHER): Payer: Self-pay | Admitting: *Deleted

## 2023-12-06 ENCOUNTER — Encounter (HOSPITAL_BASED_OUTPATIENT_CLINIC_OR_DEPARTMENT_OTHER): Payer: Self-pay | Admitting: Family Medicine

## 2023-12-06 MED ORDER — PANTOPRAZOLE SODIUM 40 MG PO TBEC: 40.0000 mg | DELAYED_RELEASE_TABLET | Freq: Every day | ORAL | 5 refills | Status: DC

## 2023-12-10 ENCOUNTER — Encounter (HOSPITAL_BASED_OUTPATIENT_CLINIC_OR_DEPARTMENT_OTHER): Payer: Self-pay | Admitting: Family Medicine

## 2023-12-11 ENCOUNTER — Other Ambulatory Visit (HOSPITAL_BASED_OUTPATIENT_CLINIC_OR_DEPARTMENT_OTHER): Payer: Self-pay | Admitting: *Deleted

## 2023-12-11 DIAGNOSIS — I2699 Other pulmonary embolism without acute cor pulmonale: Secondary | ICD-10-CM

## 2023-12-11 MED ORDER — APIXABAN 5 MG PO TABS: 5.0000 mg | ORAL_TABLET | Freq: Two times a day (BID) | ORAL | 3 refills | Status: DC

## 2023-12-13 ENCOUNTER — Ambulatory Visit: Payer: Medicaid Other

## 2023-12-14 ENCOUNTER — Encounter (HOSPITAL_BASED_OUTPATIENT_CLINIC_OR_DEPARTMENT_OTHER): Payer: Medicaid Other

## 2023-12-14 ENCOUNTER — Ambulatory Visit (HOSPITAL_BASED_OUTPATIENT_CLINIC_OR_DEPARTMENT_OTHER): Payer: Medicaid Other | Admitting: Pulmonary Disease

## 2023-12-14 ENCOUNTER — Encounter (HOSPITAL_BASED_OUTPATIENT_CLINIC_OR_DEPARTMENT_OTHER): Payer: Self-pay | Admitting: Pulmonary Disease

## 2023-12-14 ENCOUNTER — Encounter (HOSPITAL_BASED_OUTPATIENT_CLINIC_OR_DEPARTMENT_OTHER): Payer: Self-pay

## 2023-12-19 ENCOUNTER — Ambulatory Visit: Payer: Self-pay

## 2023-12-19 NOTE — Therapy (Incomplete)
OUTPATIENT PHYSICAL THERAPY SHOULDER EVALUATION   Patient Name: Kelly Lyons MRN: [id number] DOB:26-Sep-1996, 28 y.o., female Today's Date: 12/19/2023     Past Medical History:  Diagnosis Date   Eczema    on face   Pulmonary embolism [facility]    Past Surgical History:  Procedure Laterality Date   SHOULDER ARTHROSCOPY  December   For severely dislocated shoulder   Patient Active Problem List   Diagnosis Date Noted   Chronic right shoulder pain 11/23/2023   Healthcare maintenance 09/25/2023   Pulmonary embolism and infarction (HCC) 09/06/2023   Internal hemorrhoid 09/06/2023   IUD (intrauterine device) in place 08/16/2023   Bilateral pulmonary embolism (HCC) 08/13/2023   Sickle cell trait (HCC) 08/16/2019   Indication for care in labor or delivery 08/12/2019    PCP: Kelly Bible, FNP  REFERRING PROVIDER: Hilbert Lyons, *  THERAPY DIAG:  No diagnosis found.  REFERRING DIAG: Chronic right shoulder pain [M25.511, G89.29]   Rationale for Evaluation and Treatment:  Rehabilitation  SUBJECTIVE:  PERTINENT PAST HISTORY:  Hx of PE, previous R shoulder dislocation, on blood thinner       PRECAUTIONS: None  WEIGHT BEARING RESTRICTIONS No  FALLS:  Has patient fallen in last 6 months? No, Number of falls: 0  MOI/History of condition:  Onset date: Acute on chronic (for multiple years), with increased spasm over the last 3-4 weeks  SUBJECTIVE STATEMENT ***  EVAL: Kelly Lyons is a 28 y.o. female who presents to clinic with chief complaint of R shoulder pain.  She had chronic dislocations a few years ago.  After several dislocations, she had an arthroscopic surgery to to reduce the dislocations (she does not remember the name specifically) and has had some level of pain since.  She has had no subsequent dislocations.  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   Red flags:  denies   Pain:  Are you having pain? Yes Pain location: R UT and shoulder blade NPRS scale:  3/10 to 9/10 Aggravating factors: slowly worse throughout the day Relieving factors: muscle relaxer, massage, heat Pain description:  tight, aching Stage: Subacute 24 hour pattern: better in the morning and gets worse througout the day   Occupation: NA  Assistive Device: NA  Hand Dominance: R  Patient Goals/Specific Activities: reduce pain, get back to walking   OBJECTIVE:   DIAGNOSTIC FINDINGS:  X-rays clear   GENERAL OBSERVATION: WNL     SENSATION: Light touch: Appears intact   PALPATION: Significant TTP and trigger point R UT and R LS  UPPER EXTREMITY AROM:  ROM Right (Eval) Left (Eval)  Shoulder flexion N N  Shoulder abduction N N  Shoulder internal rotation    Shoulder external rotation    Functional IR N N  Functional ER N N  Shoulder extension    Elbow extension    Elbow flexion     (Blank rows = not tested, N = WNL, * = concordant pain with testing)  UPPER EXTREMITY MMT:  MMT Right (Eval) Left (Eval)  Shoulder flexion WNL WNL  Shoulder abduction (C5) WNL WNL  Shoulder ER WNL WNL  Shoulder IR WNL WNL  Middle trapezius    Lower trapezius    Shoulder extension    Grip strength    Cervical flexion (C1,C2)    Cervical S/B (C3)    Shoulder shrug (C4)    Elbow flexion (C6)  Elbow ext (C7)    Thumb ext (C8)    Finger abd (T1)    Grossly     (Blank rows = not tested, score listed is out of 5 possible points.  N = WNL, D = diminished, C = clear for gross weakness with myotome testing, * = concordant pain with testing)   UPPER EXTREMITY PROM:  PROM Right (Eval) Left (Eval)  Shoulder flexion    Shoulder abduction    Shoulder internal rotation    Shoulder external rotation    Functional IR    Functional ER    Shoulder extension    Elbow extension    Elbow flexion     (Blank rows = not tested, N = WNL, * =  concordant pain with testing)  PATIENT SURVEYS:  QuickDASH Score: 31.8 / 100 = 31.8 %    TODAY'S TREATMENT:   Kelly Lyons: discussion of condition and management as well as time frame to expect change.  Created and reviewed below HEP.  Manual therapy: Skilled palpation to identify trigger points for TDN STM to all listed muscles following TDN  Trigger Point Dry Needling  Initial Treatment: Pt instructed on Dry Needling rational, procedures, and possible side effects. Pt instructed to expect mild to moderate muscle soreness later in the day and/or into the next day.  Pt instructed in methods to reduce muscle soreness. Because Dry Needling was performed over or adjacent to a lung field, pt was educated on S/S of pneumothorax and to seek immediate medical attention should they occur.  Patient was educated on signs and symptoms of infection and other risk factors and advised to seek medical attention should they occur.  Patient verbalized understanding of these instructions and education.   Patient Verbal Consent Given: Yes Education Handout Provided: Yes Muscles Treated: R UT Electrical Stimulation Performed: No Treatment Response/Outcome: twitch     PATIENT EDUCATION:  POC, diagnosis, prognosis, HEP, and outcome measures.  Pt educated via explanation, demonstration, and handout (HEP).  Pt confirms understanding verbally.   HOME EXERCISE PROGRAM: Access Code: [email or web address] URL: [email or web address] Date: 12/04/2023 Prepared by: Kelly Lyons  Exercises - Seated Upper Trapezius Stretch  - 1 x daily - 7 x weekly - 3 reps - 45 second hold - Seated Levator Scapulae Stretch  - 1 x daily - 7 x weekly - 3 reps - 45 second hold  Treatment priorities   Eval                                                  ASSESSMENT:  CLINICAL IMPRESSION:   EVAL: Kelly Lyons is a 28 y.o. female who presents to clinic with signs and sxs consistent with R shoulder pain.  Strength  and ROM WNL.  X-ray (-).  She does have a history of dislocations that was addressed surgically years ago.  This appears myofacial in nature, mainly stemming from the R UT and R LS.  She responded well to TDN and stretching today.  Kelly Lyons will benefit from skilled PT to address relevant deficits and improve comfort with daily tasks including taking care of her daughter.    OBJECTIVE IMPAIRMENTS: Pain  ACTIVITY LIMITATIONS: recreation, housework, OH reaching, carrying, some more vigorous tasks involving her daughter  PERSONAL FACTORS: See medical history and pertinent history   REHAB POTENTIAL: Good  CLINICAL DECISION MAKING: Evolving/moderate complexity  EVALUATION  COMPLEXITY: Moderate   GOALS:   SHORT TERM GOALS: Target date: 01/01/2024   Lorena will be >75% HEP compliant to improve carryover between sessions and facilitate independent management of condition  Evaluation: ongoing Goal status: INITIAL   LONG TERM GOALS: Target date: 01/29/2024   Jeffrie will self report >/= 50% decrease in pain from evaluation to improve function in daily tasks  Evaluation/Baseline: 9/10 max pain Goal status: INITIAL   2.  Keshauna will show a >/= 7.5 pt improvement in her QUICK DASH score (MCID is 10% or ~5 pts) as a proxy for functional improvement   Evaluation/Baseline: QuickDASH Score: 31.8 / 100 = 31.8 % Goal status: INITIAL   3.  Shameeka will report confidence in self management of condition at time of discharge with advanced HEP  Evaluation/Baseline: unable to self manage Goal status: INITIAL    PLAN: PT FREQUENCY: 1-2x/week  PT DURATION: 8 weeks  PLANNED INTERVENTIONS:  97164- PT Re-evaluation, 97110-Therapeutic exercises, 97530- Therapeutic activity, 97112- Neuromuscular re-education, 97535- Self Care, 95621- Manual therapy, [id number]- Gait training, [id number]- Aquatic Therapy, [id number]- Electrical stimulation (manual), [id number]- Vasopneumatic device, [id number]- Traction (mechanical),  [id number]- Ionotophoresis 4mg /ml Dexamethasone, Taping, Dry Needling, Joint manipulation, and Spinal manipulation.   Eloy End PT  12/19/23 7:53 AM

## 2024-01-01 ENCOUNTER — Encounter (HOSPITAL_BASED_OUTPATIENT_CLINIC_OR_DEPARTMENT_OTHER): Payer: Self-pay | Admitting: Family Medicine

## 2024-01-01 ENCOUNTER — Ambulatory Visit (INDEPENDENT_AMBULATORY_CARE_PROVIDER_SITE_OTHER): Admitting: Family Medicine

## 2024-01-01 VITALS — BP 108/63 | HR 69 | Ht 60.0 in | Wt 137.5 lb

## 2024-01-01 DIAGNOSIS — F418 Other specified anxiety disorders: Secondary | ICD-10-CM | POA: Diagnosis not present

## 2024-01-01 DIAGNOSIS — F411 Generalized anxiety disorder: Secondary | ICD-10-CM | POA: Diagnosis not present

## 2024-01-01 DIAGNOSIS — F332 Major depressive disorder, recurrent severe without psychotic features: Secondary | ICD-10-CM | POA: Diagnosis not present

## 2024-01-01 DIAGNOSIS — F5105 Insomnia due to other mental disorder: Secondary | ICD-10-CM | POA: Diagnosis not present

## 2024-01-01 MED ORDER — SERTRALINE HCL 100 MG PO TABS: 100.0000 mg | ORAL_TABLET | Freq: Every day | ORAL | 3 refills | Status: DC

## 2024-01-01 NOTE — Patient Instructions (Signed)
[facility] in [place], [place] [address] Urgent Care Services (ages 29 yo and up, available 24/7) Outpatient Counseling & Psychiatry (accepts people with no insurance, available during business hours)  [place] Medicaid  (* = Spanish available;  + = Psychiatric services) * [facility] in [place]:                                     [phone] or [phone] Virtual & Onsite  [facility]:                                              [place]:                                         [phone] Virtual & Onsite  * [facility]:                                                   [phone]   [facility]                                                    [phone] Virtual & Onsite  [facility]           [phone] Virtual   [facility]:                                                           [facility]:                                      [place]:                            [facility]                                                [facility] [place]:             [phone] or [phone]   *  [facility]                                                 [phone] Virtual & Onsite    Website to Find a Therapist:       [email or web address]   Substance Use [place]:                                [phone]  Alcoholics Anonymous:      [phone]  Narcotics Anonymous:       [phone]  Quit Smoking Hotline:         800-QUIT-NOW (800-784-8669)    
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
## 2024-01-11 MED ORDER — PRAZOSIN HCL 1 MG PO CAPS: 1.0000 mg | ORAL_CAPSULE | Freq: Every day | ORAL | 3 refills | Status: AC

## 2024-01-23 ENCOUNTER — Ambulatory Visit: Payer: Medicaid Other | Admitting: Gastroenterology

## 2024-01-23 ENCOUNTER — Encounter: Payer: Self-pay | Admitting: Gastroenterology

## 2024-01-23 VITALS — BP 110/64 | HR 92 | Ht 60.0 in | Wt 136.0 lb

## 2024-01-23 DIAGNOSIS — K5900 Constipation, unspecified: Secondary | ICD-10-CM | POA: Diagnosis not present

## 2024-01-23 DIAGNOSIS — Z8719 Personal history of other diseases of the digestive system: Secondary | ICD-10-CM | POA: Diagnosis not present

## 2024-01-23 DIAGNOSIS — K219 Gastro-esophageal reflux disease without esophagitis: Secondary | ICD-10-CM | POA: Diagnosis not present

## 2024-01-23 MED ORDER — PANTOPRAZOLE SODIUM 40 MG PO TBEC: 40.0000 mg | DELAYED_RELEASE_TABLET | Freq: Every day | ORAL | 4 refills | Status: DC

## 2024-01-23 NOTE — Progress Notes (Signed)
 Chief Complaint: Refractory GERD.  Referring Provider:  Hilbert Bible, *      ASSESSMENT AND PLAN;   #1. GERD  #2. Mild constipation. H/O int hoids.  #3. H/O PE on eliquis Oct 2024 (April 29 has hematology)  Plan: -EGD may or June. (Off eliquis x 1 day-after hematology clearance, if she still is on Eliquis) -Pantoprazole 40mg  po every day #90, 4RF -Colace 1 tab po every day. -Hold off on colonoscopy at this time.  Recommend screening colonoscopy at age 28 as per current recommendations.  Certainly, earlier if with any problems.   HPI:    Kelly Lyons is a 28 y.o. female  With H/O PE Oct 2024 on eliquis (neg CTA 09/2023)  Discussed the use of AI scribe software for clinical note transcription with the patient, who gave verbal consent to proceed.  History of Present Illness Kelly Lyons is a 28 year old female who presents with gastrointestinal symptoms following hospitalization for pulmonary embolism. She is accompanied by her mother.  She underwent wisdom teeth extraction on July 30, 2023, and was prescribed hydrocodone post-procedure, which caused nausea and inability to eat. This led to a period of being bedridden for about a week and a half, during which she experienced difficulty breathing and was subsequently hospitalized. During her hospital stay from October 20 to August 22, 2023, she was diagnosed with pulmonary embolism and started on Eliquis. She is currently on Eliquis for anticoagulation following her diagnosis of bilateral pulmonary embolism in October 2024. A follow-up CT angiogram in December 2024 showed resolution of the clots.  During her hospitalization, she developed gastrointestinal symptoms, including 'sulfur burps', which are managed with pantoprazole 40 mg. When taken consistently, pantoprazole resolves these symptoms. She denies current diarrhea or constipation when taking pantoprazole and reports no nausea or vomiting during  pregnancy, except for intolerance to cow milk. She has a history of rectal bleeding and was previously on stool softeners during her hospital stay. She no longer requires stool softeners regularly but experiences constipation and diarrhea if she does not take pantoprazole. Her bowel movements are initially constipated followed by diarrhea. She has not used Miralax since her hospital discharge as her bowel movements normalized. No current straining during bowel movements but notes past difficulty.  During her hospitalization, she was found to be severely dehydrated and malnourished, which contributed to the development of internal hemorrhoids. She experienced severe weakness and near syncope due to inadequate management of dehydration with IV fluids. Her mother assisted her during this period. She continues to ensure adequate hydration to prevent recurrence.      Past Medical History:  Diagnosis Date   Eczema    on face   Pulmonary embolism [facility])     Past Surgical History:  Procedure Laterality Date   SHOULDER ARTHROSCOPY  December   For severely dislocated shoulder    Family History  Problem Relation Age of Onset   Colon cancer Neg Hx    Stomach cancer Neg Hx    Esophageal cancer Neg Hx     Social History   Tobacco Use   Smoking status: Former    Types: Cigars    Passive exposure: Past   Smokeless tobacco: Never  Vaping Use   Vaping status: Never Used  Substance Use Topics   Alcohol use: No   Drug use: Yes    Types: Marijuana    Current Outpatient Medications  Medication Sig Dispense Refill   albuterol (VENTOLIN HFA) 108 (90 Base) MCG/ACT  inhaler Inhale 2 puffs into the lungs every 6 (six) hours as needed for wheezing or shortness of breath. 8 g 2   apixaban (ELIQUIS) 5 MG TABS tablet Take 1 tablet (5 mg total) by mouth 2 (two) times daily. 60 tablet 3   budesonide-formoterol (SYMBICORT) 80-4.5 MCG/ACT inhaler Inhale 2 puffs into the lungs every 6 (six) hours as needed  (Shortness of breath). 10.2 g 3   metroNIDAZOLE (FLAGYL) 500 MG tablet Take 1 tablet (500 mg total) by mouth 2 (two) times daily. 14 tablet 0   prazosin (MINIPRESS) 1 MG capsule Take 1 capsule (1 mg total) by mouth at bedtime. 90 capsule 3   sertraline (ZOLOFT) 100 MG tablet Take 1 tablet (100 mg total) by mouth daily. 30 tablet 3   traZODone (DESYREL) 50 MG tablet Take 1 tablet (50 mg total) by mouth at bedtime. 90 tablet 3   No current facility-administered medications for this visit.    No Known Allergies  Review of Systems:  Constitutional: Denies fever, chills, diaphoresis, appetite change and fatigue.  HEENT: Denies photophobia, eye pain, redness, hearing loss, ear pain, congestion, sore throat, rhinorrhea, sneezing, mouth sores, neck pain, neck stiffness and tinnitus.   Respiratory: Denies SOB, DOE, cough, chest tightness,  and wheezing.   Cardiovascular: Denies chest pain, palpitations and leg swelling.  Genitourinary: Denies dysuria, urgency, frequency, hematuria, flank pain and difficulty urinating.  Musculoskeletal: Denies myalgias, back pain, joint swelling, arthralgias and gait problem.  Skin: No rash.  Neurological: Denies dizziness, seizures, syncope, weakness, light-headedness, numbness and headaches.  Hematological: Denies adenopathy. Easy bruising, personal or family bleeding history  Psychiatric/Behavioral: Has anxiety or depression     Physical Exam:    BP 110/64   Pulse 92   Ht 5' (1.524 m)   Wt 136 lb (61.7 kg)   LMP 12/21/2023 (Approximate)   BMI 26.56 kg/m  Wt Readings from Last 3 Encounters:  01/23/24 136 lb (61.7 kg)  01/01/24 137 lb 8 oz (62.4 kg)  11/23/23 140 lb 1.6 oz (63.5 kg)   Constitutional:  Well-developed, in no acute distress. Psychiatric: Normal mood and affect. Behavior is normal. HEENT: Pupils normal.  Conjunctivae are normal. No scleral icterus. Cardiovascular: Normal rate, regular rhythm. No edema Pulmonary/chest: Effort normal and  breath sounds normal. No wheezing, rales or rhonchi. Abdominal: Soft, nondistended. Nontender. Bowel sounds active throughout. There are no masses palpable. No hepatomegaly. Rectal: Deferred Neurological: Alert and oriented to person place and time. Skin: Skin is warm and dry. No rashes noted.  Data Reviewed: I have personally reviewed following labs and imaging studies  CBC:    Latest Ref Rng & Units 09/25/2023    1:54 PM 09/06/2023    2:48 PM 08/30/2023    8:19 PM  CBC  WBC 4.0 - 10.5 K/uL 5.0  8.7  15.2   Hemoglobin 12.0 - 15.0 g/dL 09.8  11.9  14.7   Hematocrit 36.0 - 46.0 % 38.1  39.0  39.2   Platelets 150 - 400 K/uL 284  419  507     CMP:    Latest Ref Rng & Units 09/25/2023    1:54 PM 08/30/2023    8:19 PM 06/26/2019    4:22 PM  CMP  Glucose 70 - 99 mg/dL 80  86  69   BUN 6 - 20 mg/dL 12  10  <5   Creatinine 0.44 - 1.00 mg/dL 8.29  5.62  1.30   Sodium 135 - 145 mmol/L 138  135  135   Potassium 3.5 - 5.1 mmol/L 3.8  3.7  3.5   Chloride 98 - 111 mmol/L 104  103  103   CO2 22 - 32 mmol/L 26  22  18    Calcium 8.9 - 10.3 mg/dL 9.6  9.5  9.4   Total Protein 6.5 - 8.1 g/dL 7.6  7.6  7.5   Total Bilirubin <1.2 mg/dL 0.7  0.5  0.5   Alkaline Phos 38 - 126 U/L 58  63  163   AST 15 - 41 U/L 17  23  20    ALT 0 - 44 U/L 20  27  11         Edman Circle, MD 01/23/2024, 2:31 PM  Cc: Hilbert Bible, *

## 2024-01-23 NOTE — Patient Instructions (Addendum)
 _______________________________________________________  If your blood pressure at your visit was 140/90 or greater, please contact your primary care physician to follow up on this.  _______________________________________________________  If you are age 29 or older, your body mass index should be between 23-30. Your Body mass index is 26.56 kg/m. If this is out of the aforementioned range listed, please consider follow up with your Primary Care Provider.  If you are age 30 or younger, your body mass index should be between 19-25. Your Body mass index is 26.56 kg/m. If this is out of the aformentioned range listed, please consider follow up with your Primary Care Provider.   ________________________________________________________  The [facility] providers would like to encourage you to use [facility] to communicate with providers for non-urgent requests or questions.  Due to long hold times on the telephone, sending your provider a message by [facility] may be a faster and more efficient way to get a response.  Please allow 48 business hours for a response.  Please remember that this is for non-urgent requests.  _______________________________________________________  We have sent the following medications to your pharmacy for you to pick up at your convenience: Protonix 40mg  daily  Please purchase the following medications over the counter and take as directed: Colace 1 tablet daily  You will be contacted by our office prior to your procedure for directions on holding your blood thinner.  If you do not hear from our office 2 week prior to your scheduled procedure, please call [phone] to discuss.   You have been scheduled for an endoscopy. Please follow written instructions given to you at your visit today.  If you use inhalers (even only as needed), please bring them with you on the day of your procedure.  If you take any of the following medications, they will need to be adjusted prior  to your procedure:   DO NOT TAKE 7 DAYS PRIOR TO TEST- Trulicity (dulaglutide) Ozempic, Wegovy (semaglutide) Mounjaro (tirzepatide) Bydureon Bcise (exanatide extended release)  DO NOT TAKE 1 DAY PRIOR TO YOUR TEST Rybelsus (semaglutide) Adlyxin (lixisenatide) Victoza (liraglutide) Byetta (exanatide) ___________________________________________________________________________  Thank you,  Dr. Lynann Bologna

## 2024-01-25 ENCOUNTER — Telehealth: Payer: Self-pay

## 2024-01-25 NOTE — Telephone Encounter (Signed)
 Dr Arlana Pouch   This patient is suppose to be seeing you on February 19, 2024. We are seeing once you see this patient are you willing to say if this patient is cleared or not to have an esophagogastroduodenoscopy with Dr Chales Abrahams on 03-26-24 and if she will be able to hold her Eliquis 1 day prior to the procedure. Please message me back when you can  [facility] Pre-operative Risk Assessment     Request for surgical clearance:     Endoscopy Procedure  What type of surgery is being performed?     EGD  When is this surgery scheduled?     03-26-24  What type of clearance is required ?   Pharmacy  Are there any medications that need to be held prior to surgery and how long? Eliquis 1 day hold  Practice name and name of physician performing surgery?       Gastroenterology  What is your office phone and fax number?      Phone- [phone]  Fax- [phone]  Anesthesia type (None, local, MAC, general) ?       MAC   Please route your response to [facility])

## 2024-01-25 NOTE — Telephone Encounter (Signed)
 Made patient aware. Told her to call us with changes and she voiced understanding

## 2024-01-28 ENCOUNTER — Encounter (HOSPITAL_BASED_OUTPATIENT_CLINIC_OR_DEPARTMENT_OTHER): Payer: Medicaid Other | Admitting: Family Medicine

## 2024-02-01 ENCOUNTER — Inpatient Hospital Stay: Payer: Medicaid Other | Attending: Oncology

## 2024-02-01 DIAGNOSIS — Z87891 Personal history of nicotine dependence: Secondary | ICD-10-CM | POA: Diagnosis not present

## 2024-02-01 DIAGNOSIS — Z7901 Long term (current) use of anticoagulants: Secondary | ICD-10-CM | POA: Insufficient documentation

## 2024-02-01 DIAGNOSIS — Z86711 Personal history of pulmonary embolism: Secondary | ICD-10-CM | POA: Diagnosis present

## 2024-02-01 DIAGNOSIS — I2699 Other pulmonary embolism without acute cor pulmonale: Secondary | ICD-10-CM

## 2024-02-01 LAB — CBC WITH DIFFERENTIAL/PLATELET
Abs Immature Granulocytes: 0.01 10*3/uL (ref 0.00–0.07)
Basophils Absolute: 0 10*3/uL (ref 0.0–0.1)
Basophils Relative: 0 %
Eosinophils Absolute: 0.2 10*3/uL (ref 0.0–0.5)
Eosinophils Relative: 3 %
HCT: 40.2 % (ref 36.0–46.0)
Hemoglobin: 14.4 g/dL (ref 12.0–15.0)
Immature Granulocytes: 0 %
Lymphocytes Relative: 43 %
Lymphs Abs: 2.4 10*3/uL (ref 0.7–4.0)
MCH: 29.1 pg (ref 26.0–34.0)
MCHC: 35.8 g/dL (ref 30.0–36.0)
MCV: 81.2 fL (ref 80.0–100.0)
Monocytes Absolute: 0.4 10*3/uL (ref 0.1–1.0)
Monocytes Relative: 7 %
Neutro Abs: 2.7 10*3/uL (ref 1.7–7.7)
Neutrophils Relative %: 47 %
Platelets: 264 10*3/uL (ref 150–400)
RBC: 4.95 MIL/uL (ref 3.87–5.11)
RDW: 13.2 % (ref 11.5–15.5)
WBC: 5.7 10*3/uL (ref 4.0–10.5)
nRBC: 0 % (ref 0.0–0.2)

## 2024-02-01 LAB — D-DIMER, QUANTITATIVE: D-Dimer, Quant: <0.27 ug{FEU}/mL (ref 0.00–0.50)

## 2024-02-02 LAB — CARDIOLIPIN ANTIBODIES, IGG, IGM, IGA
Anticardiolipin IgA: <9 U/mL (ref 0–11)
Anticardiolipin IgG: <9 GPL U/mL (ref 0–14)
Anticardiolipin IgM: 17 [MPL'U]/mL — ABNORMAL HIGH (ref 0–12)

## 2024-02-15 ENCOUNTER — Encounter: Payer: Self-pay | Admitting: Gastroenterology

## 2024-02-19 ENCOUNTER — Other Ambulatory Visit: Payer: Medicaid Other

## 2024-02-19 ENCOUNTER — Encounter: Payer: Self-pay | Admitting: Oncology

## 2024-02-19 ENCOUNTER — Inpatient Hospital Stay (HOSPITAL_BASED_OUTPATIENT_CLINIC_OR_DEPARTMENT_OTHER): Payer: Medicaid Other | Admitting: Oncology

## 2024-02-19 VITALS — BP 105/59 | HR 77 | Temp 98.3°F | Resp 18 | Ht 60.0 in | Wt 131.6 lb

## 2024-02-19 DIAGNOSIS — Z86711 Personal history of pulmonary embolism: Secondary | ICD-10-CM | POA: Diagnosis not present

## 2024-02-19 DIAGNOSIS — I2699 Other pulmonary embolism without acute cor pulmonale: Secondary | ICD-10-CM

## 2024-02-19 DIAGNOSIS — Z0000 Encounter for general adult medical examination without abnormal findings: Secondary | ICD-10-CM

## 2024-02-19 NOTE — Assessment & Plan Note (Signed)
-  Ensure Pap smear is up to date as part of routine cancer screening.

## 2024-02-19 NOTE — Progress Notes (Signed)
 [facility]  HEMATOLOGY CLINIC PROGRESS NOTE  PATIENT NAME: Kelly [facility]   MR#: [id number] DOB: 11/27/1995  Patient Care Team: Nonda Bays, FNP as PCP - General (Family Medicine)  Date of visit: 02/19/2024   ASSESSMENT & PLAN:   Kelly [facility] is a 28 y.o. lady with a past medical history of internal hemorrhoids, sickle cell trait, was referred to our service in December 2024 for history of bilateral pulmonary embolism diagnosed on 08/13/2023 in [place] .   Bilateral pulmonary embolism (HCC) History of bilateral pulmonary embolism following wisdom tooth extraction. No known risk factors identified. Currently on Eliquis  5 mg twice daily with resolution of clots on recent imaging. Family history of blood clots in distant aunts. -Continue Eliquis  5mg  twice daily for a minimum of six months, possibly extending to one year.  I reviewed hypercoagulable workup that was done at outside facility in [place]  in October 2024: Factor V Leiden mutation, prothrombin gene mutation were both negative.  Protein S antigen and Antithrombin 3 antigen level were both low but these could be falsely low because of acute clot.  Confirmatory testing for lupus anticoagulant was negative.  Anticardiolipin IgM was indeterminate at 15 units/mL.  Anticardiolipin IgG and IgA were undetectable.  Beta-2  glycoprotein antibodies were negative.   On her initial consultation with us  on 09/25/2023, D-dimer was undetectable. We pursued additional hypercoagulable workup for completion: I repeated anticardiolipin antibody, beta-2  glycoprotein antibody, lupus anticoagulant panel.  We also checked protein C activity, protein S activity, Antithrombin III  activity.  Anticardiolipin IgM was weakly positive at 14.  Beta-2  glycoprotein antibodies, lupus anticoagulant were negative.  Protein C activity, protein S activity, Antithrombin III  activity were all within normal limits.   After completion of  6 months of anticoagulation, we discussed continuing anticoagulation versus stopping anticoagulation and proceeding with prophylactic dosing.  Patient opted to continue prophylactic dose until she completes Eliquis  prescription followed by switching to aspirin 81 mg daily for clot prevention.   If patient were to have recurrence of thromboembolism, she will need indefinite anticoagulation and she verbalized understanding of this fact.  Discussed symptoms to watch for including chest pain, shortness of breath, dizziness, lightheadedness, leg swelling, redness or pain etc.  Healthcare maintenance -Ensure Pap smear is up to date as part of routine cancer screening.  Weak positive anti-cardiolipin antibody Anti-cardiolipin antibody IgM remains weak positive at 17, which is indeterminate. Explained that a definite positive is 80 or more. Discussed that blood thinners can affect this test and the significance is unclear at this level.  Follow-up - Schedule follow-up appointment in three months to reassess condition and review lab results.   I spent a total of 25 minutes during this encounter with the patient including review of chart and various tests results, discussions about plan of care and coordination of care plan.  I reviewed lab results and outside records for this visit and discussed relevant results with the patient. Diagnosis, plan of care and treatment options were also discussed in detail with the patient. Opportunity provided to ask questions and answers provided to her apparent satisfaction. Provided instructions to call our clinic with any problems, questions or concerns prior to return visit. I recommended to continue follow-up with PCP and sub-specialists. She verbalized understanding and agreed with the plan. No barriers to learning was detected.  [name], MD  02/19/2024 11:57 AM  [facility] - A DEPT OF [facility] [phone]  [place]  [address] [place] [address] Dept: [phone] Dept Fax: [phone]   CHIEF COMPLAINT/ REASON FOR VISIT:  Follow-up for history of bilateral pulmonary embolism, diagnosed in October 2024.  Grossly negative hypercoagulable workup.  INTERVAL HISTORY:  Discussed the use of AI scribe software for clinical note transcription with the patient, who gave verbal consent to proceed.  History of Present Illness Kelly Lyons is a 28 year old female with a history of blood clots who presents with persistent fatigue and dizziness.  She has been experiencing persistent fatigue since the diagnosis of blood clots. The fatigue was not present prior to the diagnosis and has been ongoing. She is currently on Eliquis  5 mg twice daily, with no significant side effects apart from increased bleeding with cuts and easy bruising. Recent blood work showed normal blood counts, and a D-dimer test was undetectable. An anti-cardiolipin antibody test showed a weak positive result, slightly higher than previous tests but still indeterminate.  She experiences episodes of dizziness, particularly in the past few days, which she describes as severe enough to feel like she might fall. She associates this with difficulty eating, although she is unsure if this is the cause. No nausea, vomiting, or diarrhea, but she reports alternating between diarrhea and constipation, with recent constipation noted.  No fevers, chills, or night sweats. She has a history of headaches but states they are not irregular for her. She is vigilant about monitoring her symptoms, especially any lung-related issues, due to a past experience where she ignored symptoms that led to difficulty breathing.    SUMMARY OF HEMATOLOGIC HISTORY:  28 y.o.  lady with a past medical history of internal hemorrhoids, sickle cell trait, was referred to our service in December 2024 for history of bilateral pulmonary embolism diagnosed  on 08/13/2023 in [place] .   Patient reports to establish for primary care provider since moving from [place]  after recent hospitalization. Patient states she had a wisdom tooth removal on October 7 and began having right sided chest pain for a week prior. She went to the ER on October 21. She was also breathless and had significant shortness of breath. She had a history of cigarette, marijuana use and hormonal contraceptive use (IUD-Mirena). She denied fevers or chills, she was taking her oxycodone  per her dentist following surgery without any relief. CT PE angio showed findings consistent with acute bilateral pulmonary thromboembolism with mild right ventricular strain and associated pulmonary infarction in the right lower lobe. Initially patient was hypoxic requiring oxygen via nasal cannula but was able to wean off. She was started on IV heparin and transitioned to Xarelto.  Factor V Leiden mutation, prothrombin gene mutation were both negative.  Protein S antigen and Antithrombin 3 antigen level were both low but these could be falsely low because of acute clot.  Confirmatory testing for lupus anticoagulant was negative.  Anticardiolipin IgM was indeterminate at 15 units/mL.  Anticardiolipin IgG and IgA were undetectable.  Beta-2  glycoprotein antibodies were negative.   USG was negative for DVT in both LEs.   She had a complication of bright red rectal bleeding, placed back on IV heparin, and recommended follow-up with GI for possible internal hemorrhoids upon discharge.    Patient was transitioned to Eliquis  on October 28 after resolution of rectal bleeding and was discharged on October 29. She was recommended to follow-up with pulmonology and hematology. Mom denies personal history in patient of prior DVT, PE or coagulation disorder. Reports history of DVT and PE in maternal and paternal aunts, but  no immediate family members.   She presented to [facility] on November  7 with congestive cough. She was very compliant with her Eliquis . She had mild leukocytosis of 15 with increase in neutrophils, normal CMP, normal protein, mildly prolonged INR at 15.4. Chest x-ray was unremarkable. CTA showed multifocal pneumonia left greater than right with pleural-based masslike density in the posterior right lower lobe measuring 2.1 cm. Chest CT recommended in 3 to 4 weeks following antibiotic therapy to ensure resolution. Patient was placed on course of doxycycline .    CT chest PE protocol on 08/31/2023 and again on 09/24/2023 showed no evidence of PE now.   The patient reports no prior history of immobility or bed rest before the hospitalization, but was feeling unwell and unable to get up and do much. The patient has never been on oral contraceptives, only an IUD for birth control. The patient has a four-year-old child and had an eventful pregnancy with vaginal bleeding and bed rest a week before delivery at full term.  No history of miscarriages or abortion prior to that.  The patient is a former smoker, having smoked cigars in the past.    On her initial consultation with us  on 09/25/2023, D-dimer was undetectable. We pursued additional hypercoagulable workup for completion: I repeated anticardiolipin antibody, beta-2  glycoprotein antibody, lupus anticoagulant panel.  We also checked protein C activity, protein S activity, Antithrombin III  activity.  Anticardiolipin IgM was weakly positive at 14.  Beta-2  glycoprotein antibodies, lupus anticoagulant were negative.  Protein C activity, protein S activity, Antithrombin III  activity were all within normal limits.   After completion of 6 months of anticoagulation, we discussed continuing anticoagulation versus stopping anticoagulation and proceeding with prophylactic dosing.  Patient opted to continue prophylactic dose until she completes Eliquis  prescription followed by switching to aspirin 81 mg daily for clot prevention.   If patient  were to have recurrence of thromboembolism, she will need indefinite anticoagulation and she verbalized understanding of this fact.  Discussed symptoms to watch for including chest pain, shortness of breath, dizziness, lightheadedness, leg swelling, redness or pain etc.  I have reviewed the past medical history, past surgical history, social history and family history with the patient and they are unchanged from previous note.  ALLERGIES: She has no known allergies.  MEDICATIONS:  Current Outpatient Medications  Medication Sig Dispense Refill   apixaban  (ELIQUIS ) 5 MG TABS tablet Take 1 tablet (5 mg total) by mouth 2 (two) times daily. 60 tablet 3   pantoprazole  (PROTONIX ) 40 MG tablet Take 1 tablet (40 mg total) by mouth daily. 90 tablet 4   prazosin  (MINIPRESS ) 1 MG capsule Take 1 capsule (1 mg total) by mouth at bedtime. 90 capsule 3   sertraline  (ZOLOFT ) 100 MG tablet Take 1 tablet (100 mg total) by mouth daily. 30 tablet 3   traZODone  (DESYREL ) 50 MG tablet Take 1 tablet (50 mg total) by mouth at bedtime. 90 tablet 3   albuterol  (VENTOLIN  HFA) 108 (90 Base) MCG/ACT inhaler Inhale 2 puffs into the lungs every 6 (six) hours as needed for wheezing or shortness of breath. (Patient not taking: Reported on 02/19/2024) 8 g 2   budesonide-formoterol (SYMBICORT ) 80-4.5 MCG/ACT inhaler Inhale 2 puffs into the lungs every 6 (six) hours as needed (Shortness of breath). (Patient not taking: Reported on 02/19/2024) 10.2 g 3   No current facility-administered medications for this visit.     REVIEW OF SYSTEMS:    Review of Systems - Oncology  All other pertinent systems were reviewed with the patient and are negative.  PHYSICAL EXAMINATION:   Onc Performance Status - 02/22/24 1400       ECOG Perf Status   ECOG Perf Status Restricted in physically strenuous activity but ambulatory and able to carry out work of a light or sedentary nature, e.g., light house work, office work      KPS SCALE   KPS  % SCORE Able to carry on normal activity, minor s/s of disease              Vitals:   02/19/24 1147  BP: (!) 105/59  Pulse: 77  Resp: 18  Temp: 98.3 F (36.8 C)  SpO2: 98%   Filed Weights   02/19/24 1147  Weight: 131 lb 9.6 oz (59.7 kg)    Physical Exam Constitutional:      General: She is not in acute distress.    Appearance: Normal appearance.  HENT:     Head: Normocephalic and atraumatic.  Eyes:     General: No scleral icterus.    Conjunctiva/sclera: Conjunctivae normal.  Cardiovascular:     Rate and Rhythm: Normal rate and regular rhythm.     Heart sounds: Normal heart sounds.  Pulmonary:     Effort: Pulmonary effort is normal.     Breath sounds: Normal breath sounds.  Abdominal:     General: There is no distension.  Musculoskeletal:     Right lower leg: No edema.     Left lower leg: No edema.  Neurological:     General: No focal deficit present.     Mental Status: She is alert and oriented to person, place, and time.  Psychiatric:        Mood and Affect: Mood normal.        Behavior: Behavior normal.        Thought Content: Thought content normal.     LABORATORY DATA:   I have reviewed the data as listed.  Recent Results (from the past 2160 hours)  CBC with Differential/Platelet     Status: None   Collection Time: 02/01/24  1:06 PM  Result Value Ref Range   WBC 5.7 4.0 - 10.5 K/uL   RBC 4.95 3.87 - 5.11 MIL/uL   Hemoglobin 14.4 12.0 - 15.0 g/dL   HCT 16.1 09.6 - 04.5 %   MCV 81.2 80.0 - 100.0 fL   MCH 29.1 26.0 - 34.0 pg   MCHC 35.8 30.0 - 36.0 g/dL   RDW 40.9 81.1 - 91.4 %   Platelets 264 150 - 400 K/uL   nRBC 0.0 0.0 - 0.2 %   Neutrophils Relative % 47 %   Neutro Abs 2.7 1.7 - 7.7 K/uL   Lymphocytes Relative 43 %   Lymphs Abs 2.4 0.7 - 4.0 K/uL   Monocytes Relative 7 %   Monocytes Absolute 0.4 0.1 - 1.0 K/uL   Eosinophils Relative 3 %   Eosinophils Absolute 0.2 0.0 - 0.5 K/uL   Basophils Relative 0 %   Basophils Absolute 0.0 0.0 -  0.1 K/uL   Immature Granulocytes 0 %   Abs Immature Granulocytes 0.01 0.00 - 0.07 K/uL    Comment: Performed at [facility], [address], [place], [place] [address]  D-dimer, quantitative     Status: None   Collection Time: 02/01/24  1:06 PM  Result Value Ref Range   D-Dimer, Quant <0.27 0.00 - 0.50 ug/mL-FEU    Comment: (NOTE) At the manufacturer cut-off  value of 0.5 g/mL FEU, this assay has a negative predictive value of 95-100%.This assay is intended for use in conjunction with a clinical pretest probability (PTP) assessment model to exclude pulmonary embolism (PE) and deep venous thrombosis (DVT) in outpatients suspected of PE or DVT. Results should be correlated with clinical presentation. Performed at [facility], [address] [place], [place] [address]   Cardiolipin antibodies, IgG, IgM, IgA     Status: Abnormal   Collection Time: 02/01/24  1:06 PM  Result Value Ref Range   Anticardiolipin IgG <9 0 - 14 GPL U/mL    Comment: (NOTE)                          Negative:              <15                          Indeterminate:     15 - 20                          Low-Med Positive: >20 - 80                          High Positive:         >80    Anticardiolipin IgM 17 (H) 0 - 12 MPL U/mL    Comment: (NOTE)                          Negative:              <13                          Indeterminate:     13 - 20                          Low-Med Positive: >20 - 80                          High Positive:         >80    Anticardiolipin IgA <9 0 - 11 APL U/mL    Comment: (NOTE)                          Negative:              <12                          Indeterminate:     12 - 20                          Low-Med Positive: >20 - 80                          High Positive:         >80 Performed At: [facility] [address] [place], [place] [id number] Pearlean Botts MD WU:[id number]      RADIOGRAPHIC STUDIES:  No recent  pertinent imaging studies available to review.  No orders of the defined types were placed in this encounter.  Future Appointments  Date Time Provider Department [facility]  03/11/2024  1:10 PM Nonda Bays, FNP [facility]  03/26/2024  9:30 AM Lajuan Pila, MD [facility]     This document was completed utilizing speech recognition software. Grammatical errors, random word insertions, pronoun errors, and incomplete sentences are an occasional consequence of this system due to software limitations, ambient noise, and hardware issues. Any formal questions or concerns about the content, text or information contained within the body of this dictation should be directly addressed to the provider for clarification.

## 2024-02-19 NOTE — Assessment & Plan Note (Addendum)
 History of bilateral pulmonary embolism following wisdom tooth extraction. No known risk factors identified. Currently on Eliquis  5 mg twice daily with resolution of clots on recent imaging. Family history of blood clots in distant aunts. -Continue Eliquis  5mg  twice daily for a minimum of six months, possibly extending to one year.  I reviewed hypercoagulable workup that was done at outside facility in [place]  in October 2024: Factor V Leiden mutation, prothrombin gene mutation were both negative.  Protein S antigen and Antithrombin 3 antigen level were both low but these could be falsely low because of acute clot.  Confirmatory testing for lupus anticoagulant was negative.  Anticardiolipin IgM was indeterminate at 15 units/mL.  Anticardiolipin IgG and IgA were undetectable.  Beta-2  glycoprotein antibodies were negative.   On her initial consultation with us  on 09/25/2023, D-dimer was undetectable. We pursued additional hypercoagulable workup for completion: I repeated anticardiolipin antibody, beta-2  glycoprotein antibody, lupus anticoagulant panel.  We also checked protein C activity, protein S activity, Antithrombin III  activity.  Anticardiolipin IgM was weakly positive at 14.  Beta-2  glycoprotein antibodies, lupus anticoagulant were negative.  Protein C activity, protein S activity, Antithrombin III  activity were all within normal limits.   After completion of 6 months of anticoagulation, we discussed continuing anticoagulation versus stopping anticoagulation and proceeding with prophylactic dosing.  Patient opted to continue prophylactic dose until she completes Eliquis  prescription followed by switching to aspirin 81 mg daily for clot prevention.   If patient were to have recurrence of thromboembolism, she will need indefinite anticoagulation and she verbalized understanding of this fact.  Discussed symptoms to watch for including chest pain, shortness of breath, dizziness, lightheadedness, leg  swelling, redness or pain etc.

## 2024-02-22 ENCOUNTER — Encounter: Payer: Self-pay | Admitting: Oncology

## 2024-03-11 ENCOUNTER — Ambulatory Visit (INDEPENDENT_AMBULATORY_CARE_PROVIDER_SITE_OTHER): Admitting: Family Medicine

## 2024-03-11 ENCOUNTER — Other Ambulatory Visit (HOSPITAL_COMMUNITY)
Admission: RE | Admit: 2024-03-11 | Discharge: 2024-03-11 | Disposition: A | Discharge status: Home / Self Care | Source: Ambulatory Visit | Attending: Family Medicine | Admitting: Family Medicine

## 2024-03-11 VITALS — BP 100/62 | HR 69 | Ht 60.0 in | Wt 128.6 lb

## 2024-03-11 DIAGNOSIS — F332 Major depressive disorder, recurrent severe without psychotic features: Secondary | ICD-10-CM

## 2024-03-11 DIAGNOSIS — Z124 Encounter for screening for malignant neoplasm of cervix: Secondary | ICD-10-CM | POA: Diagnosis present

## 2024-03-11 DIAGNOSIS — Z13220 Encounter for screening for lipoid disorders: Secondary | ICD-10-CM | POA: Diagnosis not present

## 2024-03-11 DIAGNOSIS — Z0000 Encounter for general adult medical examination without abnormal findings: Secondary | ICD-10-CM | POA: Diagnosis not present

## 2024-03-11 MED ORDER — SERTRALINE HCL 25 MG PO TABS: 25.0000 mg | ORAL_TABLET | Freq: Every day | ORAL | 3 refills | Status: DC

## 2024-03-11 NOTE — Progress Notes (Signed)
 Subjective:   Kelly Lyons Nov 18, 1995  03/11/2024   CC: Chief Complaint  Patient presents with   Annual Exam    Patient is here today for her physical. Denies any main concerns for today's visit.    HPI: Kelly Lyons is a 28 y.o. female who presents for a routine health maintenance exam.  Labs collected at time of visit.    HEALTH SCREENINGS: - Vision Screening: Recommended - Dental Visits: Recommended - Pap smear: pap done - Breast Exam: not applicable - STD Screening: Ordered today - Mammogram (40+): Not applicable  - Colonoscopy (45+): Not applicable  - Bone Density (65+ or under 65 with predisposing conditions): Not applicable  - Lung CA screening with low-dose CT:  Not applicable Adults age 20-80 who are current cigarette smokers or quit within the last 15 years. Must have 20 pack year history.   Depression and Anxiety Screen done today and results listed below:     03/11/2024    1:22 PM 01/01/2024    1:53 PM 11/23/2023   11:13 AM 09/25/2023    1:47 PM 09/25/2023    1:46 PM  Depression screen PHQ 2/9  Decreased Interest 1 0 0 0 0  Down, Depressed, Hopeless 1 3 0 0 0  PHQ - 2 Score 2 3 0 0 0  Altered sleeping 3 1 0 0 0  Tired, decreased energy 3 3 0 3 3  Change in appetite 3 3 0 0 0  Feeling bad or failure about yourself  1 3 0 0 0  Trouble concentrating 3 3 0 0 0  Moving slowly or fidgety/restless 0 3 0 0 0  Suicidal thoughts 0 1 0 0 0  PHQ-9 Score 15 20 0 3 3  Difficult doing work/chores Somewhat difficult Very difficult Not difficult at all        03/11/2024    1:23 PM 01/01/2024    1:54 PM 11/23/2023   11:13 AM 09/06/2023    3:23 PM  GAD 7 : Generalized Anxiety Score  Nervous, Anxious, on Edge 2 3 0 0  Control/stop worrying 3 3 0 0  Worry too much - different things 3 3 0 0  Trouble relaxing 1 3 0 0  Restless 1 3 0 0  Easily annoyed or irritable 0 3 0 0  Afraid - awful might happen 0 3 0 0  Total GAD 7 Score 10 21 0 0  Anxiety Difficulty  Not difficult at all Very difficult Not difficult at all     IMMUNIZATIONS: - Tdap: Tetanus vaccination status reviewed: Declined. - HPV: Up to date - Influenza: Postponed to flu season - Pneumovax: Not applicable - Prevnar 20: Not applicable - Shingrix (50+): Not applicable   Past medical history, surgical history, medications, allergies, family history and social history reviewed with patient today and changes made to appropriate areas of the chart.   Past Medical History:  Diagnosis Date   Eczema    on face   Indication for care in labor or delivery 08/12/2019   Pulmonary embolism [facility])     Past Surgical History:  Procedure Laterality Date   SHOULDER ARTHROSCOPY  December   For severely dislocated shoulder    Current Outpatient Medications on File Prior to Visit  Medication Sig   albuterol  (VENTOLIN  HFA) 108 (90 Base) MCG/ACT inhaler Inhale 2 puffs into the lungs every 6 (six) hours as needed for wheezing or shortness of breath.   apixaban  (ELIQUIS ) 2.5 MG TABS tablet Take 2.5  mg by mouth 2 (two) times daily.   budesonide-formoterol (SYMBICORT ) 80-4.5 MCG/ACT inhaler Inhale 2 puffs into the lungs every 6 (six) hours as needed (Shortness of breath).   pantoprazole  (PROTONIX ) 40 MG tablet Take 1 tablet (40 mg total) by mouth daily.   prazosin  (MINIPRESS ) 1 MG capsule Take 1 capsule (1 mg total) by mouth at bedtime.   sertraline  (ZOLOFT ) 100 MG tablet Take 1 tablet (100 mg total) by mouth daily.   traZODone  (DESYREL ) 50 MG tablet Take 1 tablet (50 mg total) by mouth at bedtime.   No current facility-administered medications on file prior to visit.    No Known Allergies   Social History   Socioeconomic History   Marital status: Single    Spouse name: Not on file   Number of children: 1   Years of education: Not on file   Highest education level: GED or equivalent  Occupational History   Occupation: unemployed  Tobacco Use   Smoking status: Former    Types:  Cigars    Passive exposure: Past   Smokeless tobacco: Never  Vaping Use   Vaping status: Never Used  Substance and Sexual Activity   Alcohol use: No   Drug use: Yes    Types: Marijuana   Sexual activity: Yes  Other Topics Concern   Not on file  Social History Narrative   Not on file   Social Drivers of Health   Financial Resource Strain: Low Risk  (03/11/2024)   Overall Financial Resource Strain (CARDIA)    Difficulty of Paying Living Expenses: Not very hard  Food Insecurity: Food Insecurity Present (03/11/2024)   Hunger Vital Sign    Worried About Running Out of Food in the Last Year: Sometimes true    Ran Out of Food in the Last Year: Sometimes true  Transportation Needs: Unmet Transportation Needs (03/11/2024)   PRAPARE - Transportation    Lack of Transportation (Medical): Yes    Lack of Transportation (Non-Medical): Yes  Physical Activity: Unknown (03/11/2024)   Exercise Vital Sign    Days of Exercise per Week: 0 days    Minutes of Exercise per Session: Not on file  Stress: No Stress Concern Present (03/11/2024)   [employer] of Occupational Health - Occupational Stress Questionnaire    Feeling of Stress : Only a little  Social Connections: Socially Isolated (03/11/2024)   Social Connection and Isolation Panel [NHANES]    Frequency of Communication with Friends and Family: Twice a week    Frequency of Social Gatherings with Friends and Family: Once a week    Attends Religious Services: Never    Database administrator or Organizations: No    Attends Engineer, structural: Not on file    Marital Status: Never married  Intimate Partner Violence: Not At Risk (09/25/2023)   Humiliation, Afraid, Rape, and Kick questionnaire    Fear of Current or Ex-Partner: No    Emotionally Abused: No    Physically Abused: No    Sexually Abused: No   Social History   Tobacco Use  Smoking Status Former   Types: Cigars   Passive exposure: Past  Smokeless Tobacco Never    Social History   Substance and Sexual Activity  Alcohol Use No    Family History  Problem Relation Age of Onset   Colon cancer Neg Hx    Stomach cancer Neg Hx    Esophageal cancer Neg Hx      ROS: Denies fever, fatigue, unexplained weight  loss/gain, chest pain, SHOB, and palpitations. Denies neurological deficits, gastrointestinal or genitourinary complaints, and skin changes.   Objective:   Today's Vitals   03/11/24 1317  BP: 100/62  Pulse: 69  SpO2: 99%  Weight: 128 lb 9.6 oz (58.3 kg)  Height: 5' (1.524 m)    GENERAL APPEARANCE: Well-appearing, in NAD. Well nourished.  SKIN: Pink, warm and dry. Turgor normal. No rash, lesion, ulceration, or ecchymoses. Hair evenly distributed.  HEENT: HEAD: Normocephalic.  EYES: PERRLA. EOMI. Lids intact w/o defect. Sclera white, Conjunctiva pink w/o exudate.  EARS: External ear w/o redness, swelling, masses or lesions. EAC clear. TM's intact, translucent w/o bulging, appropriate landmarks visualized. Appropriate acuity to conversational tones.  NOSE: Septum midline w/o deformity. Nares patent, mucosa pink and non-inflamed w/o drainage. No sinus tenderness.  THROAT: Uvula midline. Oropharynx clear. Tonsils non-inflamed w/o exudate. Oral mucosa pink and moist.  NECK: Supple, Trachea midline. Full ROM w/o pain or tenderness. No lymphadenopathy. Thyroid non-tender w/o enlargement or palpable masses.  BREASTS: Breasts pendulous, symmetrical, and w/o palpable masses. Nipples everted and w/o discharge. No rash or skin retraction. No axillary or supraclavicular lymphadenopathy.  RESPIRATORY: Chest wall symmetrical w/o masses. Respirations even and non-labored. Breath sounds clear to auscultation bilaterally. No wheezes, rales, rhonchi, or crackles. CARDIAC: S1, S2 present, regular rate and rhythm. No gallops, murmurs, rubs, or clicks. PMI w/o lifts, heaves, or thrills. No carotid bruits. Capillary refill <2 seconds. Peripheral pulses 2+  bilaterally. GI: Abdomen soft w/o distention. Normoactive bowel sounds. No palpable masses or tenderness. No guarding or rebound tenderness. Liver and spleen w/o tenderness or enlargement. No CVA tenderness.  GU: External genitalia without erythema, lesions, or masses. No lymphadenopathy. Vaginal mucosa pink and moist without exudate, lesions, or ulcerations. Cervix pink without discharge. Cervical os closed. Uterus and adnexae palpable, not enlarged, and w/o tenderness. No palpable masses.  MSK: Muscle tone and strength appropriate for age, w/o atrophy or abnormal movement.  EXTREMITIES: Active ROM intact, w/o tenderness, crepitus, or contracture. No obvious joint deformities or effusions. No clubbing, edema, or cyanosis.  NEUROLOGIC: CN's II-XII intact. Motor strength symmetrical with no obvious weakness. No sensory deficits. DTR's 2+ symmetric bilaterally. Steady, even gait.  PSYCH/MENTAL STATUS: Alert, oriented x 3. Cooperative, appropriate mood and affect.   Chaperoned by Therisa Flatten, DNP Student, RN    Assessment & Plan:  1. Encounter for Papanicolaou smear for cervical cancer screening (Primary) Will notify patient of results when available. Discussed mild vaginal dryness and recommend Replens vaginal moisturizer to improve dyspareunia.  - Cytology - PAP  2. Annual physical exam Discussed preventative screenings, vaccines and healthy lifestyle. Will obtain lab today.   3. Screening for lipid disorders - Lipid panel  4. Severe episode of recurrent major depressive disorder, without psychotic features (HCC) Improved, but not controlled. Will increase Sertaline to 125mg  daily. Continue current regimen. Follow up in 6 months or sooner if needed. Safety plan reviewed. - sertraline  (ZOLOFT ) 25 MG tablet; Take 1 tablet (25 mg total) by mouth daily.  Dispense: 30 tablet; Refill: 3   Orders Placed This Encounter  Procedures   Lipid panel    PATIENT COUNSELING:  - Encouraged a healthy  well-balanced diet. Patient may adjust caloric intake to maintain or achieve ideal body weight. May reduce intake of dietary saturated fat and total fat and have adequate dietary potassium and calcium  preferably from fresh fruits, vegetables, and low-fat dairy products.   - Advised to avoid cigarette smoking. - Discussed with the patient that most  people either abstain from alcohol or drink within safe limits (<=14/week and <=4 drinks/occasion for males, <=7/weeks and <= 3 drinks/occasion for females) and that the risk for alcohol disorders and other health effects rises proportionally with the number of drinks per week and how often a drinker exceeds daily limits. - Discussed cessation/primary prevention of drug use and availability of treatment for abuse.  - Discussed sexually transmitted diseases, avoidance of unintended pregnancy and contraceptive alternatives.  - Stressed the importance of regular exercise - Injury prevention: Discussed safety belts, safety helmets, smoke detector, smoking near bedding or upholstery.  - Dental health: Discussed importance of regular tooth brushing, flossing, and dental visits.   NEXT PREVENTATIVE PHYSICAL DUE IN 1 YEAR.  Return in about 6 months (around 09/11/2024) for Follow up Insomnia and Mood , PE .  Patient to reach out to office if new, worrisome, or unresolved symptoms arise or if no improvement in patient's condition. Patient verbalized understanding and is agreeable to treatment plan. All questions answered to patient's satisfaction.    Nonda Bays, [place]

## 2024-03-11 NOTE — Patient Instructions (Addendum)
 Replens Vaginal Moisturizer  [place] Jelly as needed   Armed forces technical officer for Adults   [facility] [address] [place], [place] [address] [phone] Access provides dental services to  uninsured [facility] [employer] residents  who are enrolled in the [facility]. Services will  be limited to comprehensive  examinations, extractions, fillings, pain  management and some minor  restorative care. In order to qualify for  services, patients will be referred by  [facility]  [facility]) Safety Net Organizations that  already provide medical care to  residents with incomes between 0% and  200% of the federal poverty level.   Payment Options $40.00 per visit  (CASH ONLY)    [facility] Our Dental Clinic is open Monday through Thursday from 7:30 am - 4:00 pm. The dental clinic is available for adults and children with:  [place] Medicaid, [place] Health Choice, [facility], and Self-Pay. Uninsured patients can be seen on a sliding fee scale based on household income.    [facility] of [facility] Medicine [facility] [address] [place], [place] [address] Phone [phone] Fax [phone]  Our clinic is open Monday through Friday 8:00 a.m. until 5:00 p.m, with the exception of Wednesday 10 a.m. to 5 p.m.  Medicaid and other insurance plans are welcome. Payment for services is due when services are rendered and may be made by cash or credit card.  If you have dental insurance, we will assist you with your claim submission   [email or web address]  [facility] Locations: [facility] [address] Johnella Naas [place], [place] [address] [phone]   [facility] [address] [place], [place] [address] [phone]  [facility] [address] [place], [place] [address] [phone]  [facility] [address] Alana H[place], [place] [address] [phone]  [facility] [address], [place], [place] [address] [phone]    [facility] Location: [facility] [address], [address] [place], [place] [address] Details: This 45-chair MOM Clinic is open to all patients; no pre-registration is required. Doors open at 6:00 a.m.

## 2024-03-12 ENCOUNTER — Ambulatory Visit (HOSPITAL_BASED_OUTPATIENT_CLINIC_OR_DEPARTMENT_OTHER): Payer: Self-pay | Admitting: Family Medicine

## 2024-03-12 DIAGNOSIS — R87612 Low grade squamous intraepithelial lesion on cytologic smear of cervix (LGSIL): Secondary | ICD-10-CM

## 2024-03-12 LAB — LIPID PANEL
Chol/HDL Ratio: 2.7 ratio (ref 0.0–4.4)
Cholesterol, Total: 138 mg/dL (ref 100–199)
HDL: 51 mg/dL (ref >39)
LDL Chol Calc (NIH): 76 mg/dL (ref 0–99)
Triglycerides: 49 mg/dL (ref 0–149)
VLDL Cholesterol Cal: 11 mg/dL (ref 5–40)

## 2024-03-12 NOTE — Progress Notes (Signed)
 Hi Thana,  Your cholesterol is excellent and much improved from 6 months ago. Continue good dietary choices and exercise. We will check this yearly.

## 2024-03-19 LAB — CYTOLOGY - PAP
Chlamydia: NEGATIVE
Comment: NEGATIVE
Comment: NEGATIVE
Comment: NORMAL
Neisseria Gonorrhea: NEGATIVE
Trichomonas: NEGATIVE

## 2024-03-19 NOTE — Progress Notes (Signed)
 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 Due to this, further testing including a colposcopy is recommended. This is performed by OBGYN. I have placed a referral and they should be contacting you regarding setting up an appointment here at [facility]. If you have further questions, please let me know.

## 2024-03-20 NOTE — Telephone Encounter (Signed)
 Spoke with Kelly Lyons about the referral they received for Kelly Lyons to be established with OBGYN at [facility]. She told me due to current staffing issues, at this moment they are not taking in any new patients.  Patient will need to be referred to a different office.  Alexis, please advise on this and also on the mychart message sent by Kelly Lyons. Kelly Lyons does want to try to get in with an OBGYN as soon as she can.

## 2024-03-21 ENCOUNTER — Other Ambulatory Visit (HOSPITAL_BASED_OUTPATIENT_CLINIC_OR_DEPARTMENT_OTHER): Payer: Self-pay | Admitting: Family Medicine

## 2024-03-21 DIAGNOSIS — R87612 Low grade squamous intraepithelial lesion on cytologic smear of cervix (LGSIL): Secondary | ICD-10-CM | POA: Insufficient documentation

## 2024-03-26 ENCOUNTER — Encounter: Admitting: Gastroenterology

## 2024-04-01 ENCOUNTER — Ambulatory Visit: Admitting: Gastroenterology

## 2024-04-01 ENCOUNTER — Encounter: Payer: Self-pay | Admitting: Gastroenterology

## 2024-04-01 VITALS — BP 131/77 | HR 52 | Temp 97.5°F | Resp 14 | Ht 60.0 in | Wt 136.0 lb

## 2024-04-01 DIAGNOSIS — K219 Gastro-esophageal reflux disease without esophagitis: Secondary | ICD-10-CM

## 2024-04-01 DIAGNOSIS — K2950 Unspecified chronic gastritis without bleeding: Secondary | ICD-10-CM

## 2024-04-01 MED ORDER — PANTOPRAZOLE SODIUM 20 MG PO TBEC: 20.0000 mg | DELAYED_RELEASE_TABLET | Freq: Every day | ORAL | 4 refills | Status: AC

## 2024-04-01 MED ORDER — SODIUM CHLORIDE 0.9 % IV SOLN: 500.0000 mL | Freq: Once | INTRAVENOUS | Status: DC

## 2024-04-01 NOTE — Patient Instructions (Signed)

## 2024-04-01 NOTE — Progress Notes (Signed)
 Pt sedate, gd SR's, VSS, report to RN

## 2024-04-01 NOTE — Op Note (Signed)
 [name] Procedure Date: 04/01/2024 7:28 AM MRN: [id number] Endoscopist: Lajuan Pila , MD, [id number] Age: 28 Referring MD:  Date of Birth: 1996-05-29 Gender: Female Account #: [id number] Procedure:                Upper GI endoscopy Indications:              Epigastric abdominal pain, Heartburn Medicines:                Monitored Anesthesia Care Procedure:                Pre-Anesthesia Assessment:                           - Prior to the procedure, a History and Physical                            was performed, and patient medications and                            allergies were reviewed. The patient's tolerance of                            previous anesthesia was also reviewed. The risks                            and benefits of the procedure and the sedation                            options and risks were discussed with the patient.                            All questions were answered, and informed consent                            was obtained. Prior Anticoagulants: Eliquis  on hold                            for 2 days before. ASA Grade Assessment: II - A                            patient with mild systemic disease. After reviewing                            the risks and benefits, the patient was deemed in                            satisfactory condition to undergo the procedure.                           After obtaining informed consent, the endoscope was                            passed under direct vision. Throughout the  procedure, the patient's blood pressure, pulse, and                            oxygen saturations were monitored continuously. The                            Olympus Scope [id number] was introduced through the                            mouth, and advanced to the second part of duodenum.                            The upper GI endoscopy was accomplished without                             difficulty. The patient tolerated the procedure                            well. Scope In: Scope Out: Findings:                 The examined esophagus was normal. Biopsies were                            obtained from the proximal and distal esophagus                            with cold forceps for histology of suspected                            eosinophilic esophagitis.                           The Z-line was regular and was found 36 cm from the                            incisors.                           The entire examined stomach was normal. Biopsies                            were taken with a cold forceps for histology.                            Estimated blood loss: none.                           The examined duodenum was normal. Complications:            No immediate complications. Estimated Blood Loss:     Estimated blood loss: none. Impression:               - Normal EGD Recommendation:           - Patient has a contact number available for  emergencies. The signs and symptoms of potential                            delayed complications were discussed with the                            patient. Return to normal activities tomorrow.                            Written discharge instructions were provided to the                            patient.                           - Resume previous diet.                           - Follow biopsies.                           - Can decrease Protonix  20 mg p.o. daily #90, 4RF.                            Can always try it every other day or on as-needed                            basis.                           - Resume Eliquis  (apixaban ) at prior dose 6/11.                           - The findings and recommendations were discussed                            with the patient's family. Lajuan Pila, MD 04/01/2024 8:48:29 AM This report has been signed electronically.

## 2024-04-01 NOTE — Progress Notes (Signed)
 Called to room to assist during endoscopic procedure.  Patient ID and intended procedure confirmed with present staff. Received instructions for my participation in the procedure from the performing physician.

## 2024-04-01 NOTE — Progress Notes (Signed)
 Kelly Lyons

## 2024-04-01 NOTE — Progress Notes (Signed)
 re

## 2024-04-01 NOTE — Progress Notes (Signed)
 Chief Complaint: Refractory GERD.  Referring Provider:  Nonda Bays, *      ASSESSMENT AND PLAN;   #1. GERD  #2. Mild constipation. H/O int hoids.  #3. H/O PE on eliquis  Oct 2024 (April 29 has hematology)  Plan: -EGD may or June. (Off eliquis  x 1 day-after hematology clearance, if she still is on Eliquis ) -Pantoprazole  40mg  po every day #90, 4RF -Colace 1 tab po every day. -Hold off on colonoscopy at this time.  Recommend screening colonoscopy at age 45 as per current recommendations.  Certainly, earlier if with any problems.   HPI:    Kelly Lyons is a 28 y.o. female  With H/O PE Oct 2024 on eliquis  (neg CTA 09/2023)  Discussed the use of AI scribe software for clinical note transcription with the patient, who gave verbal consent to proceed.  History of Present Illness Kelly Lyons is a 28 year old female who presents with gastrointestinal symptoms following hospitalization for pulmonary embolism. She is accompanied by her mother.  She underwent wisdom teeth extraction on July 30, 2023, and was prescribed hydrocodone post-procedure, which caused nausea and inability to eat. This led to a period of being bedridden for about a week and a half, during which she experienced difficulty breathing and was subsequently hospitalized. During her hospital stay from October 20 to August 22, 2023, she was diagnosed with pulmonary embolism and started on Eliquis . She is currently on Eliquis  for anticoagulation following her diagnosis of bilateral pulmonary embolism in October 2024. A follow-up CT angiogram in December 2024 showed resolution of the clots.  During her hospitalization, she developed gastrointestinal symptoms, including 'sulfur burps', which are managed with pantoprazole  40 mg. When taken consistently, pantoprazole  resolves these symptoms. She denies current diarrhea or constipation when taking pantoprazole  and reports no nausea or vomiting during  pregnancy, except for intolerance to cow milk. She has a history of rectal bleeding and was previously on stool softeners during her hospital stay. She no longer requires stool softeners regularly but experiences constipation and diarrhea if she does not take pantoprazole . Her bowel movements are initially constipated followed by diarrhea. She has not used Miralax since her hospital discharge as her bowel movements normalized. No current straining during bowel movements but notes past difficulty.  During her hospitalization, she was found to be severely dehydrated and malnourished, which contributed to the development of internal hemorrhoids. She experienced severe weakness and near syncope due to inadequate management of dehydration with IV fluids. Her mother assisted her during this period. She continues to ensure adequate hydration to prevent recurrence.      Past Medical History:  Diagnosis Date   Eczema    on face   Indication for care in labor or delivery 08/12/2019   Pulmonary embolism [facility])     Past Surgical History:  Procedure Laterality Date   SHOULDER ARTHROSCOPY  December   For severely dislocated shoulder    Family History  Problem Relation Age of Onset   Colon cancer Neg Hx    Stomach cancer Neg Hx    Esophageal cancer Neg Hx     Social History   Tobacco Use   Smoking status: Former    Types: Cigars    Passive exposure: Past   Smokeless tobacco: Never  Vaping Use   Vaping status: Never Used  Substance Use Topics   Alcohol use: No   Drug use: Yes    Types: Marijuana    Current Outpatient Medications  Medication Sig Dispense  Refill   albuterol  (VENTOLIN  HFA) 108 (90 Base) MCG/ACT inhaler Inhale 2 puffs into the lungs every 6 (six) hours as needed for wheezing or shortness of breath. 8 g 2   apixaban  (ELIQUIS ) 2.5 MG TABS tablet Take 2.5 mg by mouth 2 (two) times daily.     budesonide-formoterol (SYMBICORT ) 80-4.5 MCG/ACT inhaler Inhale 2 puffs into the  lungs every 6 (six) hours as needed (Shortness of breath). 10.2 g 3   pantoprazole  (PROTONIX ) 40 MG tablet Take 1 tablet (40 mg total) by mouth daily. 90 tablet 4   prazosin  (MINIPRESS ) 1 MG capsule Take 1 capsule (1 mg total) by mouth at bedtime. 90 capsule 3   sertraline  (ZOLOFT ) 100 MG tablet Take 1 tablet (100 mg total) by mouth daily. 30 tablet 3   sertraline  (ZOLOFT ) 25 MG tablet Take 1 tablet (25 mg total) by mouth daily. 30 tablet 3   traZODone  (DESYREL ) 50 MG tablet Take 1 tablet (50 mg total) by mouth at bedtime. 90 tablet 3   No current facility-administered medications for this visit.    No Known Allergies  Review of Systems:  Constitutional: Denies fever, chills, diaphoresis, appetite change and fatigue.  HEENT: Denies photophobia, eye pain, redness, hearing loss, ear pain, congestion, sore throat, rhinorrhea, sneezing, mouth sores, neck pain, neck stiffness and tinnitus.   Respiratory: Denies SOB, DOE, cough, chest tightness,  and wheezing.   Cardiovascular: Denies chest pain, palpitations and leg swelling.  Genitourinary: Denies dysuria, urgency, frequency, hematuria, flank pain and difficulty urinating.  Musculoskeletal: Denies myalgias, back pain, joint swelling, arthralgias and gait problem.  Skin: No rash.  Neurological: Denies dizziness, seizures, syncope, weakness, light-headedness, numbness and headaches.  Hematological: Denies adenopathy. Easy bruising, personal or family bleeding history  Psychiatric/Behavioral: Has anxiety or depression     Physical Exam:    BP 109/66   Pulse 73   Temp (!) 97.5 F (36.4 C) (Temporal)   Ht 5' (1.524 m)   Wt 136 lb (61.7 kg)   LMP 03/26/2024 (Approximate)   SpO2 96%   BMI 26.56 kg/m  Wt Readings from Last 3 Encounters:  04/01/24 136 lb (61.7 kg)  03/11/24 128 lb 9.6 oz (58.3 kg)  02/19/24 131 lb 9.6 oz (59.7 kg)   Constitutional:  Well-developed, in no acute distress. Psychiatric: Normal mood and affect. Behavior is  normal. HEENT: Pupils normal.  Conjunctivae are normal. No scleral icterus. Cardiovascular: Normal rate, regular rhythm. No edema Pulmonary/chest: Effort normal and breath sounds normal. No wheezing, rales or rhonchi. Abdominal: Soft, nondistended. Nontender. Bowel sounds active throughout. There are no masses palpable. No hepatomegaly. Rectal: Deferred Neurological: Alert and oriented to person place and time. Skin: Skin is warm and dry. No rashes noted.  Data Reviewed: I have personally reviewed following labs and imaging studies  CBC:    Latest Ref Rng & Units 02/01/2024    1:06 PM 09/25/2023    1:54 PM 09/06/2023    2:48 PM  CBC  WBC 4.0 - 10.5 K/uL 5.7  5.0  8.7   Hemoglobin 12.0 - 15.0 g/dL 82.9  56.2  13.0   Hematocrit 36.0 - 46.0 % 40.2  38.1  39.0   Platelets 150 - 400 K/uL 264  284  419     CMP:    Latest Ref Rng & Units 09/25/2023    1:54 PM 08/30/2023    8:19 PM 06/26/2019    4:22 PM  CMP  Glucose 70 - 99 mg/dL 80  86  69  BUN 6 - 20 mg/dL 12  10  <5   Creatinine 0.44 - 1.00 mg/dL 1.61  0.96  0.45   Sodium 135 - 145 mmol/L 138  135  135   Potassium 3.5 - 5.1 mmol/L 3.8  3.7  3.5   Chloride 98 - 111 mmol/L 104  103  103   CO2 22 - 32 mmol/L 26  22  18    Calcium  8.9 - 10.3 mg/dL 9.6  9.5  9.4   Total Protein 6.5 - 8.1 g/dL 7.6  7.6  7.5   Total Bilirubin <1.2 mg/dL 0.7  0.5  0.5   Alkaline Phos 38 - 126 U/L 58  63  163   AST 15 - 41 U/L 17  23  20    ALT 0 - 44 U/L 20  27  11         Magnus Schuller, MD 04/01/2024, 7:37 AM  Cc: Nonda Bays, *

## 2024-04-01 NOTE — Progress Notes (Signed)
 Pt's states no medical or surgical changes since previsit or office visit.

## 2024-04-02 ENCOUNTER — Telehealth: Payer: Self-pay

## 2024-04-02 NOTE — Telephone Encounter (Signed)
 Left message on follow up call.

## 2024-04-03 LAB — SURGICAL PATHOLOGY

## 2024-04-06 ENCOUNTER — Ambulatory Visit: Payer: Self-pay | Admitting: Gastroenterology

## 2024-04-17 ENCOUNTER — Other Ambulatory Visit (HOSPITAL_BASED_OUTPATIENT_CLINIC_OR_DEPARTMENT_OTHER): Payer: Self-pay | Admitting: Family Medicine

## 2024-04-17 DIAGNOSIS — F332 Major depressive disorder, recurrent severe without psychotic features: Secondary | ICD-10-CM

## 2024-04-17 NOTE — Telephone Encounter (Signed)
 Checked to see if I had a PA request in CMM and no PA is showing up. Checked pt's chart and it looks ike pt was prescribed sertraline  in place of the prozac.    Prozac is not on pt's current med list.   For clarification routing to [place] for her to review when she returns back to the office.

## 2024-05-07 ENCOUNTER — Telehealth: Payer: Self-pay | Admitting: Oncology

## 2024-05-07 NOTE — Telephone Encounter (Signed)
 Reschedule OV with correct provider

## 2024-05-15 ENCOUNTER — Telehealth: Payer: Self-pay | Admitting: Oncology

## 2024-05-15 NOTE — Telephone Encounter (Signed)
 PT rescheduled left VM

## 2024-05-19 ENCOUNTER — Other Ambulatory Visit (HOSPITAL_BASED_OUTPATIENT_CLINIC_OR_DEPARTMENT_OTHER): Payer: Self-pay | Admitting: Family Medicine

## 2024-05-19 DIAGNOSIS — F332 Major depressive disorder, recurrent severe without psychotic features: Secondary | ICD-10-CM

## 2024-05-20 ENCOUNTER — Ambulatory Visit: Admitting: Oncology

## 2024-05-20 ENCOUNTER — Inpatient Hospital Stay

## 2024-05-26 ENCOUNTER — Emergency Department (HOSPITAL_BASED_OUTPATIENT_CLINIC_OR_DEPARTMENT_OTHER): Admitting: Radiology

## 2024-05-26 ENCOUNTER — Ambulatory Visit: Admission: EM | Admit: 2024-05-26 | Discharge: 2024-05-26 | Disposition: A | Discharge status: Home / Self Care

## 2024-05-26 ENCOUNTER — Other Ambulatory Visit: Payer: Self-pay

## 2024-05-26 ENCOUNTER — Emergency Department (HOSPITAL_BASED_OUTPATIENT_CLINIC_OR_DEPARTMENT_OTHER)
Admission: EM | Admit: 2024-05-26 | Discharge: 2024-05-27 | Disposition: A | Discharge status: Home / Self Care | Attending: Emergency Medicine | Admitting: Emergency Medicine

## 2024-05-26 DIAGNOSIS — S93492A Sprain of other ligament of left ankle, initial encounter: Secondary | ICD-10-CM | POA: Insufficient documentation

## 2024-05-26 DIAGNOSIS — Z7901 Long term (current) use of anticoagulants: Secondary | ICD-10-CM | POA: Insufficient documentation

## 2024-05-26 DIAGNOSIS — A599 Trichomoniasis, unspecified: Secondary | ICD-10-CM | POA: Insufficient documentation

## 2024-05-26 DIAGNOSIS — S93402A Sprain of unspecified ligament of left ankle, initial encounter: Secondary | ICD-10-CM

## 2024-05-26 DIAGNOSIS — W1842XA Slipping, tripping and stumbling without falling due to stepping into hole or opening, initial encounter: Secondary | ICD-10-CM | POA: Insufficient documentation

## 2024-05-26 DIAGNOSIS — J029 Acute pharyngitis, unspecified: Secondary | ICD-10-CM | POA: Diagnosis not present

## 2024-05-26 DIAGNOSIS — M25572 Pain in left ankle and joints of left foot: Secondary | ICD-10-CM | POA: Diagnosis present

## 2024-05-26 LAB — GROUP A STREP BY PCR: Group A Strep by PCR: NOT DETECTED

## 2024-05-26 LAB — WET PREP, GENITAL
Sperm: NONE SEEN
WBC, Wet Prep HPF POC: >=10 — AB (ref <10)
Yeast Wet Prep HPF POC: NONE SEEN

## 2024-05-26 MED ORDER — METRONIDAZOLE 500 MG PO TABS
500.0000 mg | ORAL_TABLET | Freq: Once | ORAL | Status: AC
Start: 2024-05-26 — End: 2024-05-26
  Administered 2024-05-26: 500 mg via ORAL
  Filled 2024-05-26: qty 1

## 2024-05-26 MED ORDER — METRONIDAZOLE 500 MG PO TABS: 500.0000 mg | ORAL_TABLET | Freq: Two times a day (BID) | ORAL | 0 refills | Status: DC

## 2024-05-26 NOTE — ED Provider Notes (Signed)
 [place] EMERGENCY DEPARTMENT AT [facility] Provider Note   CSN: [id number] Arrival date & time: 05/26/24  8158     Patient presents with: Foot Injury   Kelly Lyons is a 28 y.o. female.    Patient presents to the emergency department today for evaluation of left lower extremity injury.  Patient states that she stepped in a water meter hole yesterday and twisted her ankle.  She has had pain with weightbearing since that time.  No knee or hip pain.  She denies other injuries.  Currently pain in the ankle and foot.  She also has has had a sore throat, no fevers.  Reports history of chronic tonsillitis.  No other URI symptoms.  She is able to swallow.  She thought she noted a white patch on the left side of her throat this morning.  Also requesting STI testing.  Denies any active symptoms.  She is just requesting screening.       Prior to Admission medications   Medication Sig Start Date End Date Taking? Authorizing Provider  albuterol  (VENTOLIN  HFA) 108 (90 Base) MCG/ACT inhaler Inhale 2 puffs into the lungs every 6 (six) hours as needed for wheezing or shortness of breath. 09/06/23   Caudle, Thersia Bitters, FNP  apixaban  (ELIQUIS ) 2.5 MG TABS tablet Take 2.5 mg by mouth 2 (two) times daily.    [provider]  budesonide-formoterol (SYMBICORT ) 80-4.5 MCG/ACT inhaler Inhale 2 puffs into the lungs every 6 (six) hours as needed (Shortness of breath). 09/27/23   Caudle, Thersia Bitters, FNP  pantoprazole  (PROTONIX ) 20 MG tablet Take 1 tablet (20 mg total) by mouth daily. 04/01/24   Charlanne Groom, MD  prazosin  (MINIPRESS ) 1 MG capsule Take 1 capsule (1 mg total) by mouth at bedtime. 01/11/24   Knute Thersia Bitters, FNP  sertraline  (ZOLOFT ) 100 MG tablet TAKE 1 TABLET BY MOUTH EVERY DAY 05/19/24   Caudle, Thersia Bitters, FNP  sertraline  (ZOLOFT ) 25 MG tablet Take 1 tablet (25 mg total) by mouth daily. 03/11/24   Caudle, Thersia Bitters, FNP  traZODone  (DESYREL ) 50 MG tablet  Take 1 tablet (50 mg total) by mouth at bedtime. 09/27/23   Knute Thersia Bitters, FNP    Allergies: Patient has no known allergies.    Review of Systems  Updated Vital Signs BP 104/63   Pulse 65   Temp 98.3 F (36.8 C) (Temporal)   Resp 17   Ht 5' (1.524 m)   Wt 54.4 kg   LMP 05/17/2024 (Exact Date)   SpO2 100%   BMI 23.44 kg/m   Physical Exam Vitals and nursing note reviewed.  Constitutional:      Appearance: She is well-developed.  HENT:     Head: Normocephalic and atraumatic.     Mouth/Throat:     Pharynx: Posterior oropharyngeal erythema present. No oropharyngeal exudate.     Comments: Generalized mild erythema, posterior oropharynx.  No tonsillar hypertrophy.  No signs of peritonsillar abscess.  Normal voice.  Handling secretions. Eyes:     Pupils: Pupils are equal, round, and reactive to light.  Cardiovascular:     Pulses: Normal pulses. No decreased pulses.          Dorsalis pedis pulses are 2+ on the right side and 2+ on the left side.  Musculoskeletal:        General: Tenderness present.     Cervical back: Normal range of motion and neck supple.     Left knee: Normal range of motion. No tenderness.  Left lower leg: No tenderness or bony tenderness.     Left ankle: Tenderness present over the lateral malleolus. No proximal fibula tenderness. Normal range of motion.     Left foot: Normal range of motion. Tenderness present. No bony tenderness.  Skin:    General: Skin is warm and dry.  Neurological:     Mental Status: She is alert.     Sensory: No sensory deficit.     Comments: Motor, sensation, and vascular distal to the injury is fully intact.   Psychiatric:        Mood and Affect: Mood normal.     (all labs ordered are listed, but only abnormal results are displayed) Labs Reviewed  WET PREP, GENITAL - Abnormal; Notable for the following components:      Result Value   Trich, Wet Prep PRESENT (*)    Clue Cells Wet Prep HPF POC PRESENT (*)    WBC,  Wet Prep HPF POC >=10 (*)    All other components within normal limits  GROUP A STREP BY PCR  GC/CHLAMYDIA PROBE AMP () NOT AT [facility]    EKG: None  Radiology: No results found.   Procedures   Medications Ordered in the ED - No data to display  ED Course  Patient seen and examined. History obtained directly from patient.   Labs/EKG: Strep testing ordered in triage, personally reviewed and interpreted, negative.  Added GC/chlamydia and wet prep.  Patient will self swab.  Imaging: Personally reviewed and interpreted imaging of the foot and ankle, no obvious fractures noted.  Awaiting radiology read.  Medications/Fluids: None ordered  Most recent vital signs reviewed and are as follows: BP 104/63   Pulse 65   Temp 98.3 F (36.8 C) (Temporal)   Resp 17   Ht 5' (1.524 m)   Wt 54.4 kg   LMP 05/17/2024 (Exact Date)   SpO2 100%   BMI 23.44 kg/m   Initial impression: Left ankle injury, likely sprain; sore throat negative strep; screening for STI.  9:20 PM Reassessment performed. Patient appears stable, comfortable.  Labs personally reviewed and interpreted including: Wet prep positive for trichomonas and clue cells.  X-rays were negative for fracture.  Reviewed pertinent lab work and imaging with patient at bedside. Questions answered.   Most current vital signs reviewed and are as follows: BP 104/62   Pulse 60   Temp 98.3 F (36.8 C) (Temporal)   Resp 17   Ht 5' (1.524 m)   Wt 54.4 kg   LMP 05/17/2024 (Exact Date)   SpO2 98%   BMI 23.44 kg/m   Plan: Discharge to home.  Will defer further treatment for now, patient will be notified with any positive results in regards to the gonorrhea chlamydia testing.  Discussed that this will usually take 24-48 hours to return.  Prescriptions written for: Metronidazole  x 7 days  Other home care instructions discussed: RICE protocol, use of crutches/ASO as needed for discomfort.  ED return instructions discussed:  New or worsening symptoms  Follow-up instructions discussed: Patient encouraged to follow-up with their PCP in 7 days.                                     Medical Decision Making Amount and/or Complexity of Data Reviewed Labs: ordered. Radiology: ordered.  Risk Prescription drug management.   Fall, mechanical: X-rays were negative for fracture, suspect left ankle sprain.  Wet prep screening: Positive for trichomoniasis, will initiate treatment.  Awaiting results of gonorrhea and Chlamydia testing.  Patient asymptomatic.  Sore throat: Strep negative, no evidence of abscess or other concerning findings.  Good range of motion and do not suspect deep space abscess, epiglottitis.  The patient's vital signs, pertinent lab work and imaging were reviewed and interpreted as discussed in the ED course. Hospitalization was considered for further testing, treatments, or serial exams/observation. However as patient is well-appearing, has a stable exam, and reassuring studies today, I do not feel that they warrant admission at this time. This plan was discussed with the patient who verbalizes agreement and comfort with this plan and seems reliable and able to return to the Emergency Department with worsening or changing symptoms.       Final diagnoses:  Sprain of left ankle, unspecified ligament, initial encounter  Trichomoniasis  Sore throat    ED Discharge Orders          Ordered    metroNIDAZOLE  (FLAGYL ) 500 MG tablet  2 times daily        05/26/24 2118               Desiderio Chew, DEVONNA 05/26/24 2122    Dean Clarity, MD 05/26/24 2330

## 2024-05-26 NOTE — ED Triage Notes (Addendum)
 Pt POV reporting L foot/ankle pain after stepping in hole in ground. Also reporting sore throat r/t chronic tonsillitis. Pt also requesting STD screening, denies sx at this time.

## 2024-05-26 NOTE — Discharge Instructions (Signed)
 Please read and follow all provided instructions.  Your diagnoses today include:  1. Sprain of left ankle, unspecified ligament, initial encounter   2. Trichomoniasis   3. Sore throat     Tests performed today include: An x-ray of your ankle and foot - do NOT show any broken bones Wet prep: Positive for trichomoniasis Gonorrhea and Chlamydia testing: Will not return for the next 24-48 hours, you will be notified with any positive results and need to return for treatment. Vital signs. See below for your results today.   Medications prescribed:  Metronidazole  - antibiotic  You have been prescribed an antibiotic medicine: take the entire course of medicine even if you are feeling better. Stopping early can cause the antibiotic not to work. Do not drink alcohol when taking this medication.   Take any prescribed medications only as directed.  Home care instructions:  Follow any educational materials contained in this packet Follow R.I.C.E. Protocol: R - rest your injury  I  - use ice on injury without applying directly to skin C - compress injury with bandage or splint E - elevate the injury as much as possible  Follow-up instructions: Please follow-up with your primary care provider if you continue to have significant pain or trouble walking in 1 week. In this case you may have a severe sprain that requires further care.   Return instructions:  Please return if your toes are numb or tingling, appear gray or blue, or you have severe pain (also elevate leg and loosen splint or wrap) Please return to the Emergency Department if you experience worsening symptoms.  Please return if you have any other emergent concerns.  Additional Information:  Your vital signs today were: BP 104/62   Pulse 60   Temp 98.3 F (36.8 C) (Temporal)   Resp 17   Ht 5' (1.524 m)   Wt 54.4 kg   LMP 05/17/2024 (Exact Date)   SpO2 98%   BMI 23.44 kg/m  If your blood pressure (BP) was elevated above  135/85 this visit, please have this repeated by your doctor within one month. -------------- Your caregiver has diagnosed you as suffering from an ankle sprain. Ankle sprain occurs when the ligaments that hold the ankle joint together are stretched or torn. It may take 4 to 6 weeks to heal.  For Activity: If prescribed crutches, use crutches with non-weight bearing for the first few days. Then, you may walk on your ankle as the pain allows, or as instructed. Start gradually with weight bearing on the affected ankle. Once you can walk pain free, then try jogging. When you can run forwards, then you can try moving side-to-side. If you cannot walk without crutches in one week, you need a re-check. --------------

## 2024-05-27 LAB — GC/CHLAMYDIA PROBE AMP (~~LOC~~) NOT AT ARMC
Chlamydia: NEGATIVE
Comment: NEGATIVE
Comment: NORMAL
Neisseria Gonorrhea: NEGATIVE

## 2024-06-06 ENCOUNTER — Other Ambulatory Visit: Payer: Self-pay | Admitting: Oncology

## 2024-06-06 DIAGNOSIS — I2699 Other pulmonary embolism without acute cor pulmonale: Secondary | ICD-10-CM

## 2024-06-10 ENCOUNTER — Inpatient Hospital Stay: Admitting: Oncology

## 2024-06-10 ENCOUNTER — Inpatient Hospital Stay

## 2024-06-16 ENCOUNTER — Ambulatory Visit (HOSPITAL_BASED_OUTPATIENT_CLINIC_OR_DEPARTMENT_OTHER)
Admission: RE | Admit: 2024-06-16 | Discharge: 2024-06-16 | Disposition: A | Discharge status: Home / Self Care | Source: Ambulatory Visit | Attending: Oncology

## 2024-06-16 ENCOUNTER — Inpatient Hospital Stay: Attending: Oncology

## 2024-06-16 ENCOUNTER — Encounter: Payer: Self-pay | Admitting: Oncology

## 2024-06-16 ENCOUNTER — Inpatient Hospital Stay: Admitting: Oncology

## 2024-06-16 VITALS — BP 103/77 | HR 68 | Temp 98.0°F | Resp 16 | Ht 60.0 in | Wt 124.9 lb

## 2024-06-16 DIAGNOSIS — Z87891 Personal history of nicotine dependence: Secondary | ICD-10-CM | POA: Insufficient documentation

## 2024-06-16 DIAGNOSIS — I2699 Other pulmonary embolism without acute cor pulmonale: Secondary | ICD-10-CM | POA: Diagnosis not present

## 2024-06-16 DIAGNOSIS — R760 Raised antibody titer: Secondary | ICD-10-CM | POA: Insufficient documentation

## 2024-06-16 DIAGNOSIS — R7989 Other specified abnormal findings of blood chemistry: Secondary | ICD-10-CM | POA: Insufficient documentation

## 2024-06-16 DIAGNOSIS — S93401A Sprain of unspecified ligament of right ankle, initial encounter: Secondary | ICD-10-CM | POA: Diagnosis not present

## 2024-06-16 DIAGNOSIS — X58XXXA Exposure to other specified factors, initial encounter: Secondary | ICD-10-CM | POA: Insufficient documentation

## 2024-06-16 DIAGNOSIS — Z7901 Long term (current) use of anticoagulants: Secondary | ICD-10-CM | POA: Diagnosis not present

## 2024-06-16 DIAGNOSIS — Z86711 Personal history of pulmonary embolism: Secondary | ICD-10-CM | POA: Insufficient documentation

## 2024-06-16 DIAGNOSIS — Z09 Encounter for follow-up examination after completed treatment for conditions other than malignant neoplasm: Secondary | ICD-10-CM | POA: Diagnosis present

## 2024-06-16 LAB — CBC WITH DIFFERENTIAL (CANCER CENTER ONLY)
Abs Immature Granulocytes: 0.01 K/uL (ref 0.00–0.07)
Basophils Absolute: 0 K/uL (ref 0.0–0.1)
Basophils Relative: 0 %
Eosinophils Absolute: 0.2 K/uL (ref 0.0–0.5)
Eosinophils Relative: 4 %
HCT: 40.3 % (ref 36.0–46.0)
Hemoglobin: 14.4 g/dL (ref 12.0–15.0)
Immature Granulocytes: 0 %
Lymphocytes Relative: 44 %
Lymphs Abs: 2.5 K/uL (ref 0.7–4.0)
MCH: 29.6 pg (ref 26.0–34.0)
MCHC: 35.7 g/dL (ref 30.0–36.0)
MCV: 82.8 fL (ref 80.0–100.0)
Monocytes Absolute: 0.4 K/uL (ref 0.1–1.0)
Monocytes Relative: 7 %
Neutro Abs: 2.5 K/uL (ref 1.7–7.7)
Neutrophils Relative %: 45 %
Platelet Count: 253 K/uL (ref 150–400)
RBC: 4.87 MIL/uL (ref 3.87–5.11)
RDW: 12.8 % (ref 11.5–15.5)
WBC Count: 5.6 K/uL (ref 4.0–10.5)
nRBC: 0 % (ref 0.0–0.2)

## 2024-06-16 LAB — D-DIMER, QUANTITATIVE: D-Dimer, Quant: 1.12 ug{FEU}/mL — ABNORMAL HIGH (ref 0.00–0.50)

## 2024-06-16 MED ORDER — IOHEXOL 350 MG/ML SOLN
100.0000 mL | Freq: Once | INTRAVENOUS | Status: AC | PRN
  Administered 2024-06-16: 60 mL via INTRAVENOUS

## 2024-06-16 NOTE — Assessment & Plan Note (Addendum)
 History of bilateral pulmonary embolism following wisdom tooth extraction. No known risk factors identified. Currently on Eliquis  5 mg twice daily with resolution of clots on recent imaging. Family history of blood clots in distant aunts. -Continue Eliquis  5mg  twice daily for a minimum of six months, possibly extending to one year.  I reviewed hypercoagulable workup that was done at outside facility in [place]  in October 2024: Factor V Leiden mutation, prothrombin gene mutation were both negative.  Protein S antigen and Antithrombin 3 antigen level were both low but these could be falsely low because of acute clot.  Confirmatory testing for lupus anticoagulant was negative.  Anticardiolipin IgM was indeterminate at 15 units/mL.  Anticardiolipin IgG and IgA were undetectable.  Beta-2  glycoprotein antibodies were negative.   On her initial consultation with us  on 09/25/2023, D-dimer was undetectable. We pursued additional hypercoagulable workup for completion: I repeated anticardiolipin antibody, beta-2  glycoprotein antibody, lupus anticoagulant panel.  We also checked protein C activity, protein S activity, Antithrombin III  activity.  Anticardiolipin IgM was weakly positive at 14.  Beta-2  glycoprotein antibodies, lupus anticoagulant were negative.  Protein C activity, protein S activity, Antithrombin III  activity were all within normal limits.   After completion of 6 months of anticoagulation, we discussed continuing anticoagulation versus stopping anticoagulation and proceeding with prophylactic dosing.  Patient opted to continue prophylactic dose until she completes Eliquis  prescription followed by switching to aspirin 81 mg daily for clot prevention.  She has weaned herself off of Eliquis  in July 2025.  However she did not start taking aspirin yet.  CBCD was unremarkable today.  However D-dimer was increased at 1.12.  Hence we obtained CT angiogram of the chest and it showed no evidence of PE.   Elevated D-dimer is likely inflammatory response from right ankle sprain.  She was advised to start taking aspirin 81 mg daily for clot prevention.   If patient were to have recurrence of thromboembolism, she will need indefinite anticoagulation and she verbalized understanding of this fact.  Discussed symptoms to watch for including chest pain, shortness of breath, dizziness, lightheadedness, leg swelling, redness or pain etc.  RTC in 3 months for reevaluation.

## 2024-06-16 NOTE — Progress Notes (Signed)
 [facility]  HEMATOLOGY CLINIC PROGRESS NOTE  PATIENT NAME: Kelly Lyons   MR#: [id number] DOB: 1996/08/03  Patient Care Team: Knute Thersia Bitters, FNP as PCP - General (Family Medicine)  Date of visit: 06/16/2024   ASSESSMENT & PLAN:   Kelly Lyons is a 28 y.o. lady with a past medical history of internal hemorrhoids, sickle cell trait, was referred to our service in December 2024 for history of bilateral pulmonary embolism diagnosed on 08/13/2023 in [place] .  Grossly negative hypercoagulable workup except for weak positive anticardiolipin antibody, likely from ongoing DOAC use.  Hx of Bilateral pulmonary embolism (HCC) History of bilateral pulmonary embolism following wisdom tooth extraction. No known risk factors identified. Currently on Eliquis  5 mg twice daily with resolution of clots on recent imaging. Family history of blood clots in distant aunts. -Continue Eliquis  5mg  twice daily for a minimum of six months, possibly extending to one year.  I reviewed hypercoagulable workup that was done at outside facility in [place]  in October 2024: Factor V Leiden mutation, prothrombin gene mutation were both negative.  Protein S antigen and Antithrombin 3 antigen level were both low but these could be falsely low because of acute clot.  Confirmatory testing for lupus anticoagulant was negative.  Anticardiolipin IgM was indeterminate at 15 units/mL.  Anticardiolipin IgG and IgA were undetectable.  Beta-2  glycoprotein antibodies were negative.   On her initial consultation with us  on 09/25/2023, D-dimer was undetectable. We pursued additional hypercoagulable workup for completion: I repeated anticardiolipin antibody, beta-2  glycoprotein antibody, lupus anticoagulant panel.  We also checked protein C activity, protein S activity, Antithrombin III  activity.  Anticardiolipin IgM was weakly positive at 14.  Beta-2  glycoprotein antibodies, lupus anticoagulant were  negative.  Protein C activity, protein S activity, Antithrombin III  activity were all within normal limits.   After completion of 6 months of anticoagulation, we discussed continuing anticoagulation versus stopping anticoagulation and proceeding with prophylactic dosing.  Patient opted to continue prophylactic dose until she completes Eliquis  prescription followed by switching to aspirin 81 mg daily for clot prevention.  She has weaned herself off of Eliquis  in July 2025.  However she did not start taking aspirin yet.  CBCD was unremarkable today.  However D-dimer was increased at 1.12.  Hence we obtained CT angiogram of the chest and it showed no evidence of PE.  Elevated D-dimer is likely inflammatory response from right ankle sprain.  She was advised to start taking aspirin 81 mg daily for clot prevention.   If patient were to have recurrence of thromboembolism, she will need indefinite anticoagulation and she verbalized understanding of this fact.  Discussed symptoms to watch for including chest pain, shortness of breath, dizziness, lightheadedness, leg swelling, redness or pain etc.  RTC in 3 months for reevaluation.   Weak positive anti-cardiolipin antibody Anti-cardiolipin antibody IgM remains weak positive at 17, which is indeterminate. Explained that a definite positive is 80 or more. Discussed that blood thinners can affect this test and the significance is unclear at this level.  We will recheck this on return visit, since that she is off of blood thinners now.  Right ankle sprain Right ankle sprain occurred two weeks ago, with current pain and previous use of a brace and crutches. Possible contribution to elevated D-dimer due to inflammation from the injury. - Advise continued activity as tolerated  I spent a total of 30 minutes during this encounter with the patient including review of chart and various tests results, discussions about plan  of care and coordination of care plan.  I  reviewed lab results and outside records for this visit and discussed relevant results with the patient. Diagnosis, plan of care and treatment options were also discussed in detail with the patient. Opportunity provided to ask questions and answers provided to her apparent satisfaction. Provided instructions to call our clinic with any problems, questions or concerns prior to return visit. I recommended to continue follow-up with PCP and sub-specialists. She verbalized understanding and agreed with the plan. No barriers to learning was detected.  Chinita Patten, MD  06/16/2024 4:44 PM  [facility] - A DEPT OF [facility] [address] [place] [address] Dept: [phone] Dept Fax: [phone]   CHIEF COMPLAINT/ REASON FOR VISIT:  Follow-up for history of bilateral pulmonary embolism, diagnosed in October 2024.  Grossly negative hypercoagulable workup.  INTERVAL HISTORY:  Discussed the use of AI scribe software for clinical note transcription with the patient, who gave verbal consent to proceed.  History of Present Illness  Kelly Lyons is a 28 year old female with a history of blood clots who presents for follow-up regarding anticoagulation management. She is accompanied by her mother.  She has a history of blood clots and was previously on Eliquis . She weaned herself off Eliquis  by gradually reducing the dose over a month, with the last dose taken at the end of July 2025. Her mother notes that Eliquis  made her feel woozy, prompting the tapering. She has not started taking aspirin yet. No chest pain, trouble breathing, leg swelling, or leg pain since stopping Eliquis . She is cautious about any sensations in her lungs but reports being able to breathe normally.  A CT scan of the chest in November and December 2024 showed no clots. Recent blood work revealed a positive D-dimer, which was previously undetectable  while on blood thinners.  She sprained her ankle two weeks ago, resulting in some swelling and bruising. She was on crutches for a week and is now able to walk, though she still experiences some pain. Her mother mentions that the injury occurred when her foot got stuck in a water meter hole, causing significant bruising.    SUMMARY OF HEMATOLOGIC HISTORY:  28 y.o. lady with a past medical history of internal hemorrhoids, sickle cell trait, was referred to our service in December 2024 for history of bilateral pulmonary embolism diagnosed on 08/13/2023 in [place] .   Patient reports to establish for primary care provider since moving from [place]  after recent hospitalization. Patient states she had a wisdom tooth removal on October 7 and began having right sided chest pain for a week prior. She went to the ER on October 21. She was also breathless and had significant shortness of breath. She had a history of cigarette, marijuana use and hormonal contraceptive use (IUD-Mirena). She denied fevers or chills, she was taking her oxycodone  per her dentist following surgery without any relief. CT PE angio showed findings consistent with acute bilateral pulmonary thromboembolism with mild right ventricular strain and associated pulmonary infarction in the right lower lobe. Initially patient was hypoxic requiring oxygen via nasal cannula but was able to wean off. She was started on IV heparin and transitioned to Xarelto.  Factor V Leiden mutation, prothrombin gene mutation were both negative.  Protein S antigen and Antithrombin 3 antigen level were both low but these could be falsely low because of acute clot.  Confirmatory testing for lupus anticoagulant was negative.  Anticardiolipin IgM was indeterminate at 15 units/mL.  Anticardiolipin IgG and IgA were undetectable.  Beta-2  glycoprotein antibodies were negative.   USG was negative for DVT in both LEs.   She had a complication of bright red rectal  bleeding, placed back on IV heparin, and recommended follow-up with GI for possible internal hemorrhoids upon discharge.    Patient was transitioned to Eliquis  on October 28 after resolution of rectal bleeding and was discharged on October 29. She was recommended to follow-up with pulmonology and hematology. Mom denies personal history in patient of prior DVT, PE or coagulation disorder. Reports history of DVT and PE in maternal and paternal aunts, but no immediate family members.   She presented to [facility] emergency department on November 7 with congestive cough. She was very compliant with her Eliquis . She had mild leukocytosis of 15 with increase in neutrophils, normal CMP, normal protein, mildly prolonged INR at 15.4. Chest x-ray was unremarkable. CTA showed multifocal pneumonia left greater than right with pleural-based masslike density in the posterior right lower lobe measuring 2.1 cm. Chest CT recommended in 3 to 4 weeks following antibiotic therapy to ensure resolution. Patient was placed on course of doxycycline .    CT chest PE protocol on 08/31/2023 and again on 09/24/2023 showed no evidence of PE now.   The patient reports no prior history of immobility or bed rest before the hospitalization, but was feeling unwell and unable to get up and do much. The patient has never been on oral contraceptives, only an IUD for birth control. The patient has a four-year-old child and had an eventful pregnancy with vaginal bleeding and bed rest a week before delivery at full term.  No history of miscarriages or abortion prior to that.  The patient is a former smoker, having smoked cigars in the past.    On her initial consultation with us  on 09/25/2023, D-dimer was undetectable. We pursued additional hypercoagulable workup for completion: I repeated anticardiolipin antibody, beta-2  glycoprotein antibody, lupus anticoagulant panel.  We also checked protein C activity, protein S activity, Antithrombin III   activity.  Anticardiolipin IgM was weakly positive at 14.  Beta-2  glycoprotein antibodies, lupus anticoagulant were negative.  Protein C activity, protein S activity, Antithrombin III  activity were all within normal limits.   After completion of 6 months of anticoagulation, we discussed continuing anticoagulation versus stopping anticoagulation and proceeding with prophylactic dosing.  Patient opted to continue prophylactic dose until she completes Eliquis  prescription followed by switching to aspirin 81 mg daily for clot prevention.  She discontinued Eliquis  in late July 2025.   If patient were to have recurrence of thromboembolism, she will need indefinite anticoagulation and she verbalized understanding of this fact.  Discussed symptoms to watch for including chest pain, shortness of breath, dizziness, lightheadedness, leg swelling, redness or pain etc.  I have reviewed the past medical history, past surgical history, social history and family history with the patient and they are unchanged from previous note.  ALLERGIES: She has no known allergies.  MEDICATIONS:  Current Outpatient Medications  Medication Sig Dispense Refill   pantoprazole  (PROTONIX ) 20 MG tablet Take 1 tablet (20 mg total) by mouth daily. 90 tablet 4   prazosin  (MINIPRESS ) 1 MG capsule Take 1 capsule (1 mg total) by mouth at bedtime. 90 capsule 3   sertraline  (ZOLOFT ) 100 MG tablet TAKE 1 TABLET BY MOUTH EVERY DAY 30 tablet 3   sertraline  (ZOLOFT ) 25 MG tablet Take 1 tablet (25 mg total) by mouth daily. 30  tablet 3   traZODone  (DESYREL ) 50 MG tablet Take 1 tablet (50 mg total) by mouth at bedtime. 90 tablet 3   albuterol  (VENTOLIN  HFA) 108 (90 Base) MCG/ACT inhaler Inhale 2 puffs into the lungs every 6 (six) hours as needed for wheezing or shortness of breath. (Patient not taking: Reported on 06/16/2024) 8 g 2   apixaban  (ELIQUIS ) 2.5 MG TABS tablet Take 2.5 mg by mouth 2 (two) times daily. (Patient not taking: Reported on  06/16/2024)     budesonide-formoterol (SYMBICORT ) 80-4.5 MCG/ACT inhaler Inhale 2 puffs into the lungs every 6 (six) hours as needed (Shortness of breath). (Patient not taking: Reported on 06/16/2024) 10.2 g 3   metroNIDAZOLE  (FLAGYL ) 500 MG tablet Take 1 tablet (500 mg total) by mouth 2 (two) times daily. (Patient not taking: Reported on 06/16/2024) 14 tablet 0   No current facility-administered medications for this visit.     REVIEW OF SYSTEMS:    Review of Systems - Oncology  All other pertinent systems were reviewed with the patient and are negative.  PHYSICAL EXAMINATION:   Onc Performance Status - 06/16/24 1130       ECOG Perf Status   ECOG Perf Status Restricted in physically strenuous activity but ambulatory and able to carry out work of a light or sedentary nature, e.g., light house work, office work      KPS SCALE   KPS % SCORE Able to carry on normal activity, minor s/s of disease            Vitals:   06/16/24 1120  BP: 103/77  Pulse: 68  Resp: 16  Temp: 98 F (36.7 C)  SpO2: 100%    Filed Weights   06/16/24 1120  Weight: 124 lb 14.4 oz (56.7 kg)     Physical Exam Constitutional:      General: She is not in acute distress.    Appearance: Normal appearance.  HENT:     Head: Normocephalic and atraumatic.  Eyes:     General: No scleral icterus.    Conjunctiva/sclera: Conjunctivae normal.  Cardiovascular:     Rate and Rhythm: Normal rate and regular rhythm.     Heart sounds: Normal heart sounds.  Pulmonary:     Effort: Pulmonary effort is normal.     Breath sounds: Normal breath sounds.  Abdominal:     General: There is no distension.  Musculoskeletal:     Right lower leg: No edema.     Left lower leg: No edema.  Neurological:     General: No focal deficit present.     Mental Status: She is alert and oriented to person, place, and time.  Psychiatric:        Mood and Affect: Mood normal.        Behavior: Behavior normal.        Thought  Content: Thought content normal.     LABORATORY DATA:   I have reviewed the data as listed.  Results for orders placed or performed in visit on 06/16/24 (from the past 24 hours)  D-dimer, quantitative     Status: Abnormal   Collection Time: 06/16/24 11:15 AM  Result Value Ref Range   D-Dimer, Quant 1.12 (H) 0.00 - 0.50 ug/mL-FEU  CBC with Differential (Cancer Center Only)     Status: None   Collection Time: 06/16/24 11:15 AM  Result Value Ref Range   WBC Count 5.6 4.0 - 10.5 K/uL   RBC 4.87 3.87 - 5.11 MIL/uL   Hemoglobin  14.4 12.0 - 15.0 g/dL   HCT 59.6 63.9 - 53.9 %   MCV 82.8 80.0 - 100.0 fL   MCH 29.6 26.0 - 34.0 pg   MCHC 35.7 30.0 - 36.0 g/dL   RDW 87.1 88.4 - 84.4 %   Platelet Count 253 150 - 400 K/uL   nRBC 0.0 0.0 - 0.2 %   Neutrophils Relative % 45 %   Neutro Abs 2.5 1.7 - 7.7 K/uL   Lymphocytes Relative 44 %   Lymphs Abs 2.5 0.7 - 4.0 K/uL   Monocytes Relative 7 %   Monocytes Absolute 0.4 0.1 - 1.0 K/uL   Eosinophils Relative 4 %   Eosinophils Absolute 0.2 0.0 - 0.5 K/uL   Basophils Relative 0 %   Basophils Absolute 0.0 0.0 - 0.1 K/uL   Immature Granulocytes 0 %   Abs Immature Granulocytes 0.01 0.00 - 0.07 K/uL     Recent Results (from the past 2160 hours)  Surgical pathology ([facility])     Status: None   Collection Time: 04/01/24 12:00 AM  Result Value Ref Range   SURGICAL PATHOLOGY      SURGICAL PATHOLOGY [facility] [address], [address] [place], [place] [address] Telephone [phone] or [phone] Fax [phone]  REPORT OF SURGICAL PATHOLOGY   Accession #: [id number] Patient Name: MARYLN, EASTHAM Visit # : [id number]  MRN: [id number] Physician: Charlanne Groom DOB/Age [birth year] (Age: 68) Gender: F Collected Date: 04/01/2024 Received Date: 04/02/2024  FINAL DIAGNOSIS       1. 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      NO H. PYLORI IDENTIFIED ON H&E STAIN.      NEGATIVE FOR INTESTINAL METAPLASIA OR DYSPLASIA.       2. Surgical [P], esophagus biopsy :       ESOPHAGEAL SQUAMOUS MUCOSA WITH NO SIGNIFICANT DIAGNOSTIC ALTERATION.      NO EVIDENCE OF INTRAEPITHELIAL EOSINOPHILS OR LYMPHOCYTES.       ELECTRONIC SIGNATURE : Pepper Dutton Md, Pathologist, Electronic Signature  MICROSCOPIC DESCRIPTION  CASE COMMENTS STAINS U SED IN DIAGNOSIS: H&E H&E H&E-2    CLINICAL HISTORY  SPECIMEN(S) OBTAINED 1. Surgical [P], Gastric Antrum And Gastric Body 2. Surgical [P], Esophagus Biopsy  SPECIMEN COMMENTS: 1. Gastroesophageal reflux disease, unspecified whether esophagitis present SPECIMEN CLINICAL INFORMATION: 1. R/O H.pylori 2. R/O eosinophilic esophagitis    Gross Description 1. Received in formalin are tan, soft tissue fragments that are submitted in toto.Number: 4, Size: 0.3 cm smallest to 0.5 cm largest, (1B) ( TT ) 2. Received in formalin are tan, soft tissue fragments that are submitted in toto.Number: multiple, Size: 0.2 cm smallest to 0.6 cm largest, (1B) ( TT )        Report signed out from the following location(s) [facility] [address] [place], [place] [address] CLIA #: [id number]  [facility] [address] [place], [place] [address] CLIA #: [id number]   Group A Strep by PCR     Status: None   Collection Time: 05/26/24  7:38 PM   Specimen: Throat; Sterile Swab  Result Value Ref Range   Group A Strep by PCR NOT DETECTED NOT DETECTED    Comment: Performed at [facility], [address] [place], [place] [address]  GC/Chlamydia probe amp     Status: None   Collection Time: 05/26/24  8:21 PM  Result Value Ref Range   Neisseria Gonorrhea Negative    Chlamydia Negative    Comment Normal Reference Ranger Chlamydia - Negative    Comment      Normal Reference Range Neisseria Gonorrhea - Negative  Wet  prep, genital     Status: Abnormal   Collection Time: 05/26/24  8:52 PM   Specimen: PATH Cytology Cervicovaginal Ancillary Only  Result Value Ref Range   Yeast Wet Prep HPF POC NONE SEEN NONE SEEN   Trich, Wet Prep PRESENT (A) NONE SEEN   Clue Cells Wet Prep HPF POC PRESENT (A) NONE SEEN   WBC, Wet Prep HPF POC >=10 (A) <10    Comment: Swab received with less than 0.5 mL of saline, saline added to specimen, interpret results with caution.   Sperm NONE SEEN     Comment: Performed at [facility], [address], [place], [place] [address]  D-dimer, quantitative     Status: Abnormal   Collection Time: 06/16/24 11:15 AM  Result Value Ref Range   D-Dimer, Quant 1.12 (H) 0.00 - 0.50 ug/mL-FEU    Comment: (NOTE) At the manufacturer cut-off value of 0.5 g/mL FEU, this assay has a negative predictive value of 95-100%.This assay is intended for use in conjunction with a clinical pretest probability (PTP) assessment model to exclude pulmonary embolism (PE) and deep venous thrombosis (DVT) in outpatients suspected of PE or DVT. Results should be correlated with clinical presentation. Performed at [facility], [address], [place], [place] [address]   CBC with Differential (Cancer Center Only)     Status: None   Collection Time: 06/16/24 11:15 AM  Result Value Ref Range   WBC Count 5.6 4.0 - 10.5 K/uL   RBC 4.87 3.87 - 5.11 MIL/uL   Hemoglobin 14.4 12.0 - 15.0 g/dL   HCT 59.6 63.9 - 53.9 %   MCV 82.8 80.0 - 100.0 fL   MCH 29.6 26.0 - 34.0 pg   MCHC 35.7 30.0 - 36.0 g/dL   RDW 87.1 88.4 - 84.4 %   Platelet Count 253 150 - 400 K/uL   nRBC 0.0 0.0 - 0.2 %   Neutrophils Relative % 45 %   Neutro Abs 2.5 1.7 - 7.7 K/uL   Lymphocytes Relative 44 %   Lymphs Abs 2.5 0.7 - 4.0 K/uL   Monocytes Relative 7 %   Monocytes Absolute 0.4 0.1 - 1.0 K/uL   Eosinophils Relative 4 %   Eosinophils Absolute 0.2 0.0 - 0.5 K/uL   Basophils Relative 0 %    Basophils Absolute 0.0 0.0 - 0.1 K/uL   Immature Granulocytes 0 %   Abs Immature Granulocytes 0.01 0.00 - 0.07 K/uL    Comment: Performed at [facility], [address] [place], [place] [address]     RADIOGRAPHIC STUDIES:  CT Angio Chest Pulmonary Embolism (PE) W or WO Contrast CLINICAL DATA:  Hx of bilateral pulmonary emboli, now with elevated D-dimer. Please look for any recurrence.  EXAM: CT ANGIOGRAPHY CHEST WITH CONTRAST  TECHNIQUE: Multidetector CT imaging of the chest was performed using the standard protocol during bolus administration of intravenous contrast. Multiplanar CT image reconstructions and MIPs were obtained to evaluate the vascular anatomy.  RADIATION DOSE REDUCTION: This exam was performed according to the departmental dose-optimization program which includes automated exposure control, adjustment of the mA and/or kV according to patient size and/or use of iterative reconstruction technique.  CONTRAST:  60mL OMNIPAQUE  IOHEXOL  350 MG/ML SOLN  COMPARISON:  CT angiography chest from 09/24/2023.  FINDINGS: Cardiovascular: No evidence of embolism to the proximal subsegmental pulmonary artery level.  Normal cardiac size. No pericardial effusion. No aortic aneurysm.  Mediastinum/Nodes: Visualized thyroid gland appears grossly unremarkable. No solid / cystic mediastinal masses. The esophagus is nondistended precluding optimal assessment. No axillary, mediastinal or hilar lymphadenopathy by size criteria.  Lungs/Pleura: The central tracheo-bronchial tree is patent. There is mild, smooth, circumferential thickening of the segmental and subsegmental bronchial walls, throughout bilateral lungs, which is nonspecific. Findings are most commonly seen with bronchitis or reactive airway disease, such as asthma. No mass or consolidation. No pleural effusion or pneumothorax. No suspicious lung nodules.  Upper Abdomen: Visualized upper abdominal  viscera within normal limits.  Musculoskeletal: The visualized soft tissues of the chest wall are grossly unremarkable. No suspicious osseous lesions.  Review of the MIP images confirms the above findings.  IMPRESSION: 1. No embolism to the proximal subsegmental pulmonary artery level. 2. No lung mass, consolidation, pleural effusion or pneumothorax. 3. Otherwise essentially unremarkable exam, as described above.  Electronically Signed   By: Ree Molt M.D.   On: 06/16/2024 13:52    Orders Placed This Encounter  Procedures   CT Angio Chest Pulmonary Embolism (PE) W or WO Contrast    Standing Status:   Future    Number of Occurrences:   1    Expected Date:   06/23/2024    Expiration Date:   06/16/2025    If indicated for the ordered procedure, I authorize the administration of contrast media per Radiology protocol:   Yes    Does the patient have a contrast media/X-ray dye allergy?:   No    Is patient pregnant?:   No    Preferred imaging location?:   [facility]   CBC with Differential (Cancer Center Only)    Standing Status:   Future    Expiration Date:   06/16/2025   D-dimer, quantitative    Standing Status:   Future    Expiration Date:   06/16/2025   Cardiolipin antibodies, IgG, IgM, IgA    Standing Status:   Future    Expiration Date:   06/16/2025     Future Appointments  Date Time Provider Department Center  09/11/2024  1:10 PM Knute Thersia Bitters, FNP [facility]  09/16/2024 11:00 AM [facility] PHLEBOTOMIST [facility] None  09/16/2024 11:30 AM Shevette Bess, Chinita, MD [facility] None     This document was completed utilizing speech recognition software. Grammatical errors, random word insertions, pronoun errors, and incomplete sentences are an occasional consequence of this system due to software limitations, ambient noise, and hardware issues. Any formal questions or concerns about the content, text or information contained within the body of this dictation should  be directly addressed to the provider for clarification.

## 2024-06-17 ENCOUNTER — Telehealth: Payer: Self-pay | Admitting: Family Medicine

## 2024-06-17 NOTE — Telephone Encounter (Signed)
 Called patient in regards to referral we received. Left VM with call back number for pt to call if still interested in appt.

## 2024-06-18 ENCOUNTER — Other Ambulatory Visit: Payer: Self-pay

## 2024-06-18 DIAGNOSIS — I2699 Other pulmonary embolism without acute cor pulmonale: Secondary | ICD-10-CM

## 2024-06-18 NOTE — Progress Notes (Signed)
 Patient called today to report the following to Dr. Autumn:   Headaches since end of July since discontinuing Eliquis . CT of head without contrast ordered per Dr. Autumn. Patient agrees to CT.

## 2024-06-24 ENCOUNTER — Ambulatory Visit (HOSPITAL_BASED_OUTPATIENT_CLINIC_OR_DEPARTMENT_OTHER)

## 2024-06-24 ENCOUNTER — Emergency Department (HOSPITAL_BASED_OUTPATIENT_CLINIC_OR_DEPARTMENT_OTHER)
Admission: EM | Admit: 2024-06-24 | Discharge: 2024-06-24 | Disposition: A | Discharge status: Home / Self Care | Attending: Emergency Medicine | Admitting: Emergency Medicine

## 2024-06-24 ENCOUNTER — Other Ambulatory Visit: Payer: Self-pay

## 2024-06-24 ENCOUNTER — Ambulatory Visit: Payer: Self-pay | Admitting: *Deleted

## 2024-06-24 ENCOUNTER — Encounter (HOSPITAL_BASED_OUTPATIENT_CLINIC_OR_DEPARTMENT_OTHER): Payer: Self-pay

## 2024-06-24 DIAGNOSIS — R519 Headache, unspecified: Secondary | ICD-10-CM | POA: Insufficient documentation

## 2024-06-24 DIAGNOSIS — Z7982 Long term (current) use of aspirin: Secondary | ICD-10-CM | POA: Diagnosis not present

## 2024-06-24 DIAGNOSIS — Z79899 Other long term (current) drug therapy: Secondary | ICD-10-CM | POA: Diagnosis not present

## 2024-06-24 DIAGNOSIS — R790 Abnormal level of blood mineral: Secondary | ICD-10-CM | POA: Insufficient documentation

## 2024-06-24 DIAGNOSIS — K625 Hemorrhage of anus and rectum: Secondary | ICD-10-CM | POA: Diagnosis present

## 2024-06-24 LAB — COMPREHENSIVE METABOLIC PANEL WITH GFR
ALT: 7 U/L (ref 0–44)
AST: 17 U/L (ref 15–41)
Albumin: 4.4 g/dL (ref 3.5–5.0)
Alkaline Phosphatase: 57 U/L (ref 38–126)
Anion gap: 13 (ref 5–15)
BUN: 6 mg/dL (ref 6–20)
CO2: 19 mmol/L — ABNORMAL LOW (ref 22–32)
Calcium: 9.4 mg/dL (ref 8.9–10.3)
Chloride: 104 mmol/L (ref 98–111)
Creatinine, Ser: 0.85 mg/dL (ref 0.44–1.00)
GFR, Estimated: >60 mL/min (ref >60)
Glucose, Bld: 80 mg/dL (ref 70–99)
Potassium: 3.7 mmol/L (ref 3.5–5.1)
Sodium: 137 mmol/L (ref 135–145)
Total Bilirubin: 0.5 mg/dL (ref 0.0–1.2)
Total Protein: 7.3 g/dL (ref 6.5–8.1)

## 2024-06-24 LAB — CBC
HCT: 38.4 % (ref 36.0–46.0)
Hemoglobin: 14.1 g/dL (ref 12.0–15.0)
MCH: 30.6 pg (ref 26.0–34.0)
MCHC: 36.7 g/dL — ABNORMAL HIGH (ref 30.0–36.0)
MCV: 83.3 fL (ref 80.0–100.0)
Platelets: 212 K/uL (ref 150–400)
RBC: 4.61 MIL/uL (ref 3.87–5.11)
RDW: 13.1 % (ref 11.5–15.5)
WBC: 7.8 K/uL (ref 4.0–10.5)
nRBC: 0 % (ref 0.0–0.2)

## 2024-06-24 LAB — HCG, SERUM, QUALITATIVE: Preg, Serum: NEGATIVE

## 2024-06-24 NOTE — Telephone Encounter (Signed)
 Copied from CRM [id number]. Topic: Clinical - Red Word Triage >> Jun 24, 2024  3:30 PM Zebedee SAUNDERS wrote: Red Word that prompted transfer to Nurse Triage: Pt is bleeding rectally, started today. Reason for Disposition  SEVERE rectal bleeding (e.g., large blood clots; constant or on and off bleeding)  Answer Assessment - Initial Assessment Questions 1. APPEARANCE of BLOOD: What color is it? Is it passed separately, on the surface of the stool, or mixed in with the stool?      Today I started bleeding from my rectum.    I've had this problem before while I was in the hospital in [place].   I was in for 10 days.   I was very dehydrated.   I had internal hemorrhoids.    I was bleeding bad back then.   I've not had the problem since then. Out of no where I'm having the problem again.   I just stopped Eliquis  a month ago.  My hematologist told me I didn't need it since I'm on aspirin.   The last couple of weeks I've had a headache.   Those are going way now. I'm having sulfur burps which I've had before.   2. AMOUNT: How much blood was passed?      I thought I had started my period I passed that much blood from my rectum.  When I wipe there was a lot of blood on the toilet paper.   Just like with my period.   It's bright red blood.    A little abd cramping I thought was from the sulfur burps.  Nov. Last year was last time this happened.   3. FREQUENCY: How many times has blood been passed with the stools?      One time today.   4. ONSET: When was the blood first seen in the stools? (Days or weeks)      Today  5. DIARRHEA: Is there also some diarrhea? If Yes, ask: How many diarrhea stools in the past 24 hours?      No diarrhea    Solid BMs, my normal.   I did not have to strain.   6. CONSTIPATION: Do you have constipation? If Yes, ask: How bad is it?     No 7. RECURRENT SYMPTOMS: Have you had blood in your stools before? If Yes, ask: When was the last time? and What happened that  time?      See above 8. BLOOD THINNERS: Do you take any blood thinners? (e.g., aspirin, clopidogrel / Plavix, coumadin, heparin). Notes: Other strong blood thinners include: Arixtra (fondaparinux), Eliquis  (apixaban ), Pradaxa (dabigatran), and Xarelto (rivaroxaban).     Aspirin a day.   I was on Eliquis  I had bilateral pulmonary emboli in Oct. 2024.    9. OTHER SYMPTOMS: Do you have any other symptoms?  (e.g., abdomen pain, vomiting, dizziness, fever)     No  10. PREGNANCY: Is there any chance you are pregnant? When was your last menstrual period?       Not asked  Protocols used: Rectal Bleeding-A-AH FYI Only or Action Required?: FYI only for provider.  Patient was last seen in primary care on 03/11/2024 by Knute Thersia Bitters, FNP.  Called Nurse Triage reporting Rectal Bleeding. Passed a large amount of bright red blood rectally once today a few minutes ago.   Mild abd cramping.   Having sulfur burps too. Symptoms began today. Large amount of bright red blood like her period after having a BM.  Has had this before while living in [place] and was in the hospital.     Interventions attempted: Nothing.  Symptoms are: rapidly worsening.  Triage Disposition: Go to ED Now (Notify PCP)  Going to [facility] ED location  Patient/caregiver understands and will follow disposition?: Yes

## 2024-06-24 NOTE — ED Triage Notes (Signed)
 Arrives POV with complaints of rectal bleeding that occurred today. Patient states that she had a moderate amount of bright red bleeding. Hx of internal hemorrhoids last year.  Patient states that she was on Eliquis  (stopped taking one month ago) for several months due to having Pulmonary Emboli last year.   Patient also reports ongoing headache as well. She is pending an appointment to have a scan of the head, but having insurance issues.

## 2024-06-24 NOTE — Discharge Instructions (Addendum)
 Please read and follow all provided instructions.  Your diagnoses today include:  1. Rectal bleeding    Tests performed today include: Complete blood cell count: Your hemoglobin was normal as well as your white blood cell count Basic metabolic panel: No concerning findings Pregnancy test (urine or blood, in women only): Was negative Vital signs. See below for your results today.   Medications prescribed:  None  Take any prescribed medications only as directed.  Home care instructions:  Follow any educational materials contained in this packet.  Increase the fiber in your diet, you may supplement using oral fiber supplement such as Metamucil.  Follow-up instructions: Please follow-up with your gastroenterologist in the next 1 week.  Return instructions:  Please return to the Emergency Department if you experience worsening symptoms.  Return if you develop significant rectal pain or abdominal pain, persistent bleeding.  Return if you develop any symptoms of significant anemia including lightheadedness, passing out, chest pain or shortness of breath. Please return if you have any other emergent concerns.  Additional Information:  Your vital signs today were: BP 112/72 (BP Location: Right Arm)   Pulse 68   Temp 98.3 F (36.8 C)   Resp 16   Ht 5' (1.524 m)   Wt 56.7 kg   LMP 05/17/2024 (Exact Date) Comment: IUD  SpO2 100%   BMI 24.41 kg/m  If your blood pressure (BP) was elevated above 135/85 this visit, please have this repeated by your doctor within one month. --------------

## 2024-06-24 NOTE — ED Provider Notes (Signed)
 [place] EMERGENCY DEPARTMENT AT [facility] Provider Note   CSN: [id number] Arrival date & time: 06/24/24  1701     Patient presents with: Rectal Bleeding and Headache   Kelly Lyons is a 28 y.o. female.   Patient with history of pulmonary embolism, previously on anticoagulation but this was discontinued and patient is now only on baby aspirin daily.  She reports elevated D-dimer and she is going to have this rechecked in 3 months.  Patient reports 1 episode of bright red blood per rectum today.  She noted it mixed with the stools.  She states that she had a similar episode during hospitalization for PE and was told that she had internal hemorrhoids.  Patient had an upper endoscopy this summer but no history of colonoscopy.  She denies rectal pain.  She denies abdominal pain.  Recently she has had sulfur burps.  This is been more frequent since she decreased the dose of Protonix .  She has also been having chronic headaches over the past couple months and has a CT scan scheduled.  She denies other easy bruising or bleeding, blood in the urine, bleeding from the gums.  Stools are formed, no diarrhea.       Prior to Admission medications   Medication Sig Start Date End Date Taking? Authorizing Provider  pantoprazole  (PROTONIX ) 20 MG tablet Take 1 tablet (20 mg total) by mouth daily. 04/01/24   Charlanne Groom, MD  prazosin  (MINIPRESS ) 1 MG capsule Take 1 capsule (1 mg total) by mouth at bedtime. 01/11/24   Knute Thersia Bitters, FNP  sertraline  (ZOLOFT ) 100 MG tablet TAKE 1 TABLET BY MOUTH EVERY DAY 05/19/24   Caudle, Thersia Bitters, FNP  sertraline  (ZOLOFT ) 25 MG tablet Take 1 tablet (25 mg total) by mouth daily. 03/11/24   Caudle, Thersia Bitters, FNP  traZODone  (DESYREL ) 50 MG tablet Take 1 tablet (50 mg total) by mouth at bedtime. 09/27/23   Knute Thersia Bitters, FNP    Allergies: Patient has no known allergies.    Review of Systems  Updated Vital Signs BP 112/72 (BP  Location: Right Arm)   Pulse 68   Temp 98.3 F (36.8 C)   Resp 16   Ht 5' (1.524 m)   Wt 56.7 kg   LMP 05/17/2024 (Exact Date) Comment: IUD  SpO2 100%   BMI 24.41 kg/m   Physical Exam Vitals and nursing note reviewed.  Constitutional:      General: She is not in acute distress.    Appearance: She is well-developed.  HENT:     Head: Normocephalic and atraumatic.     Right Ear: External ear normal.     Left Ear: External ear normal.     Nose: Nose normal.  Eyes:     Conjunctiva/sclera: Conjunctivae normal.  Cardiovascular:     Rate and Rhythm: Normal rate and regular rhythm.     Heart sounds: No murmur heard. Pulmonary:     Effort: No respiratory distress.     Breath sounds: No wheezing, rhonchi or rales.  Abdominal:     Palpations: Abdomen is soft.     Tenderness: There is no abdominal tenderness. There is no guarding or rebound.     Comments: No focal tenderness.  Genitourinary:    Comments: Offered rectal exam, patient defers Musculoskeletal:     Cervical back: Normal range of motion and neck supple.     Right lower leg: No edema.     Left lower leg: No edema.  Comments: Healing bruise from recent ankle injury, left ankle.  Skin:    General: Skin is warm and dry.     Findings: No rash.  Neurological:     General: No focal deficit present.     Mental Status: She is alert. Mental status is at baseline.     Motor: No weakness.  Psychiatric:        Mood and Affect: Mood normal.     (all labs ordered are listed, but only abnormal results are displayed) Labs Reviewed  COMPREHENSIVE METABOLIC PANEL WITH GFR - Abnormal; Notable for the following components:      Result Value   CO2 19 (*)    All other components within normal limits  CBC - Abnormal; Notable for the following components:   MCHC 36.7 (*)    All other components within normal limits  HCG, SERUM, QUALITATIVE    EKG: None  Radiology: No results found.   Procedures   Medications Ordered  in the ED - No data to display  ED Course  Patient seen and examined. History obtained directly from patient and parent at bedside.  Work-up including labs, imaging, EKG ordered in triage, if performed, were reviewed.    Labs/EKG: Independently reviewed and interpreted.  This included: CBC with normal hemoglobin and white blood cell count; CMP unremarkable; pregnancy negative.  Imaging: None ordered  Medications/Fluids: None ordered  Most recent vital signs reviewed and are as follows: BP 112/72 (BP Location: Right Arm)   Pulse 68   Temp 98.3 F (36.8 C)   Resp 16   Ht 5' (1.524 m)   Wt 56.7 kg   LMP 05/17/2024 (Exact Date) Comment: IUD  SpO2 100%   BMI 24.41 kg/m   Initial impression: Bright red blood per rectum x 1 episode, no rectal pain, reassuring abdominal exam.  Patient on baby aspirin only currently.  Bleeding has not seemed persistent.  I offered rectal exam.  After discussion, patient would like to defer until GI follow-up.  Home treatment plan: Increase fiber in diet, close monitoring of symptoms  Return instructions discussed with patient: Return if she develops abdominal pain or more persistent rectal bleeding, rectal pain.  Follow-up instructions discussed with patient: Follow-up with GI for further evaluation.                                   Medical Decision Making Amount and/or Complexity of Data Reviewed Labs: ordered.   Lower GI bleeding suspected given passage of heme positive stool, bright red blood per rectum, hematochezia (maroon stool), clots noted in stool, red blood on toilet paper or in toilet bowl, no melanotic stools, presentation not consistent with a large upper GI bleed.  Patient previously on anticoagulation, now only on baby aspirin daily.  The following differential diagnoses were considered for this patient's lower GI bleed: *Hemorrhoids - can be painless, blood noted on toilet paper *Anal fissure - tearing pain with defecation,  small amount of blood *Colonic polyp - usually painless *Proctitis - usually associated with passage of mucus and diarrhea *IBD - presents with crampy abdominal pain, tenesmus, fever *Infectious diarrhea - usually abrupt onset* *Diverticulosis - large volume of painless bleeding, >40yo *Mesenteric ischemia - >50yo, underlying cardiovascular disease, pain out of proportion *Colon cancer *Arteriovenous malformation  None of the following red flags identified or suspected: *Abnormal vital signs *Symptoms suggestive of malignancy such as constitutional symptoms (fever, weight loss),  anemia, or change in frequency, caliber or consistency of stools * Family history of colon cancer  Hemoglobin is normal and stable from previous.  The patient appears reasonably screened and/or stabilized for discharge and I doubt any other medical condition or other emergency medical conditions requiring further screening, evaluation, or treatment in the ED at this time prior to discharge.  Follow-up: Age less than 40, source of bleeding not identified -- unlikely to be colon cancer or other serious etiology. GI follow-up recommended.   The patient's vital signs, pertinent lab work and imaging were reviewed and interpreted as discussed in the ED course. Hospitalization was considered for further testing, treatments, or serial exams/observation. However as patient is well-appearing, has a stable exam, and reassuring studies today, I do not feel that they warrant admission at this time. This plan was discussed with the patient who verbalizes agreement and comfort with this plan and seems reliable and able to return to the Emergency Department with worsening or changing symptoms.       Final diagnoses:  Rectal bleeding    ED Discharge Orders     None            Desiderio Chew, DEVONNA 06/24/24 2126    Doretha Folks, MD 06/26/24 2102

## 2024-07-02 ENCOUNTER — Ambulatory Visit (HOSPITAL_BASED_OUTPATIENT_CLINIC_OR_DEPARTMENT_OTHER)
Admission: RE | Admit: 2024-07-02 | Discharge: 2024-07-02 | Disposition: A | Discharge status: Home / Self Care | Source: Ambulatory Visit | Attending: Oncology | Admitting: Oncology

## 2024-07-02 DIAGNOSIS — I2699 Other pulmonary embolism without acute cor pulmonale: Secondary | ICD-10-CM | POA: Diagnosis present

## 2024-07-10 ENCOUNTER — Telehealth: Payer: Self-pay

## 2024-07-10 NOTE — Telephone Encounter (Signed)
 Shared the following information with patient via voicemail per Dr. Autumn regarding patient's last CT of her head:  CT of the head was unremarkable. No explanation for her headaches. She needs to follow-up with her PCP if persistent symptoms.

## 2024-07-16 ENCOUNTER — Emergency Department (HOSPITAL_BASED_OUTPATIENT_CLINIC_OR_DEPARTMENT_OTHER)
Admission: EM | Admit: 2024-07-16 | Discharge: 2024-07-16 | Disposition: A | Discharge status: Home / Self Care | Attending: Emergency Medicine | Admitting: Emergency Medicine

## 2024-07-16 ENCOUNTER — Ambulatory Visit: Payer: Self-pay

## 2024-07-16 ENCOUNTER — Other Ambulatory Visit: Payer: Self-pay

## 2024-07-16 ENCOUNTER — Emergency Department (HOSPITAL_BASED_OUTPATIENT_CLINIC_OR_DEPARTMENT_OTHER)

## 2024-07-16 DIAGNOSIS — U071 COVID-19: Secondary | ICD-10-CM | POA: Diagnosis not present

## 2024-07-16 DIAGNOSIS — Z7982 Long term (current) use of aspirin: Secondary | ICD-10-CM | POA: Diagnosis not present

## 2024-07-16 DIAGNOSIS — R059 Cough, unspecified: Secondary | ICD-10-CM | POA: Diagnosis present

## 2024-07-16 DIAGNOSIS — Z86711 Personal history of pulmonary embolism: Secondary | ICD-10-CM | POA: Diagnosis not present

## 2024-07-16 LAB — CBC
HCT: 36.4 % (ref 36.0–46.0)
Hemoglobin: 13.5 g/dL (ref 12.0–15.0)
MCH: 31 pg (ref 26.0–34.0)
MCHC: 37.1 g/dL — ABNORMAL HIGH (ref 30.0–36.0)
MCV: 83.7 fL (ref 80.0–100.0)
Platelets: 229 K/uL (ref 150–400)
RBC: 4.35 MIL/uL (ref 3.87–5.11)
RDW: 12.7 % (ref 11.5–15.5)
WBC: 6.2 K/uL (ref 4.0–10.5)
nRBC: 0 % (ref 0.0–0.2)

## 2024-07-16 LAB — HCG, SERUM, QUALITATIVE: Preg, Serum: NEGATIVE

## 2024-07-16 LAB — TROPONIN T, HIGH SENSITIVITY: Troponin T High Sensitivity: <15 ng/L (ref 0–19)

## 2024-07-16 LAB — BASIC METABOLIC PANEL WITH GFR
Anion gap: 12 (ref 5–15)
BUN: 6 mg/dL (ref 6–20)
CO2: 20 mmol/L — ABNORMAL LOW (ref 22–32)
Calcium: 9.5 mg/dL (ref 8.9–10.3)
Chloride: 102 mmol/L (ref 98–111)
Creatinine, Ser: 0.79 mg/dL (ref 0.44–1.00)
GFR, Estimated: >60 mL/min (ref >60)
Glucose, Bld: 92 mg/dL (ref 70–99)
Potassium: 3.8 mmol/L (ref 3.5–5.1)
Sodium: 135 mmol/L (ref 135–145)

## 2024-07-16 MED ORDER — LACTATED RINGERS IV BOLUS
1000.0000 mL | Freq: Once | INTRAVENOUS | Status: AC
  Administered 2024-07-16: 1000 mL via INTRAVENOUS

## 2024-07-16 MED ORDER — KETOROLAC TROMETHAMINE 15 MG/ML IJ SOLN
15.0000 mg | Freq: Once | INTRAMUSCULAR | Status: AC
  Administered 2024-07-16: 15 mg via INTRAVENOUS
  Filled 2024-07-16: qty 1

## 2024-07-16 MED ORDER — IOHEXOL 350 MG/ML SOLN
75.0000 mL | Freq: Once | INTRAVENOUS | Status: AC | PRN
  Administered 2024-07-16: 75 mL via INTRAVENOUS

## 2024-07-16 NOTE — Discharge Instructions (Signed)
 You were seen for your COVID infection in the emergency department.  Your chest pain is likely from pleurisy  At home, please use Tylenol  and ibuprofen  for your muscle aches and fevers.  Please use over-the-counter cough medication or tea with honey for your cough.  Follow-up with your primary doctor in 2-3 days regarding your visit.  This may be over the phone.  Return immediately to the emergency department if you experience any of the following: Difficulty breathing, or any other concerning symptoms.    Thank you for visiting our Emergency Department. It was a pleasure taking care of you today.

## 2024-07-16 NOTE — ED Provider Notes (Signed)
 [place] EMERGENCY DEPARTMENT AT [facility] Provider Note   CSN: [id number] Arrival date & time: 07/16/24  9068     Patient presents with: Bodyaches   Kelly Lyons is a 28 y.o. female.   28 year old female with a history of PE currently on aspirin who presents emergency department with chest pain, cough, and URI symptoms.  Patient started feeling poorly several days ago with a headache.  Also developed a cough.  Has had some fevers and chills.  Today started having some chest pain.  Substernal and sharp.  It is pleuritic.  Radiates to her back.  Has a history of PE but is currently on aspirin and was taken off her Eliquis  several months ago.  Took a COVID test before coming into the emergency department that was positive       Prior to Admission medications   Medication Sig Start Date End Date Taking? Authorizing Provider  pantoprazole  (PROTONIX ) 20 MG tablet Take 1 tablet (20 mg total) by mouth daily. 04/01/24   Charlanne Groom, MD  prazosin  (MINIPRESS ) 1 MG capsule Take 1 capsule (1 mg total) by mouth at bedtime. 01/11/24   Knute Thersia Bitters, FNP  sertraline  (ZOLOFT ) 100 MG tablet TAKE 1 TABLET BY MOUTH EVERY DAY 05/19/24   Caudle, Thersia Bitters, FNP  sertraline  (ZOLOFT ) 25 MG tablet Take 1 tablet (25 mg total) by mouth daily. 03/11/24   Caudle, Thersia Bitters, FNP  traZODone  (DESYREL ) 50 MG tablet Take 1 tablet (50 mg total) by mouth at bedtime. 09/27/23   Knute Thersia Bitters, FNP    Allergies: Patient has no known allergies.    Review of Systems  Updated Vital Signs BP (!) 94/51   Pulse 78   Temp 99.7 F (37.6 C) (Oral)   Resp 16   LMP 04/26/2024 (Exact Date) Comment: IUD  SpO2 98%   Physical Exam Vitals and nursing note reviewed.  Constitutional:      General: She is not in acute distress.    Appearance: She is well-developed.  HENT:     Head: Normocephalic and atraumatic.     Right Ear: External ear normal.     Left Ear: External ear normal.      Nose: Nose normal.  Eyes:     Extraocular Movements: Extraocular movements intact.     Conjunctiva/sclera: Conjunctivae normal.     Pupils: Pupils are equal, round, and reactive to light.  Cardiovascular:     Rate and Rhythm: Normal rate and regular rhythm.     Heart sounds: No murmur heard.    Comments: Radial pulses 2+ bilaterally Pulmonary:     Effort: Pulmonary effort is normal. No respiratory distress.     Breath sounds: Normal breath sounds.  Musculoskeletal:     Cervical back: Normal range of motion and neck supple.     Right lower leg: No edema.     Left lower leg: No edema.  Skin:    General: Skin is warm and dry.  Neurological:     Mental Status: She is alert and oriented to person, place, and time. Mental status is at baseline.  Psychiatric:        Mood and Affect: Mood normal.     (all labs ordered are listed, but only abnormal results are displayed) Labs Reviewed  BASIC METABOLIC PANEL WITH GFR - Abnormal; Notable for the following components:      Result Value   CO2 20 (*)    All other components within normal limits  CBC -  Abnormal; Notable for the following components:   MCHC 37.1 (*)    All other components within normal limits  HCG, SERUM, QUALITATIVE  TROPONIN T, HIGH SENSITIVITY    EKG: EKG Interpretation Date/Time:  Wednesday July 16 2024 09:50:31 EDT Ventricular Rate:  90 PR Interval:  110 QRS Duration:  65 QT Interval:  369 QTC Calculation: 452 R Axis:   78  Text Interpretation: Sinus rhythm Borderline short PR interval Borderline T wave abnormalities Confirmed by Yolande Charleston [phone]) on 07/16/2024 9:53:21 AM  Radiology: CT Angio Chest PE W and/or Wo Contrast Result Date: 07/16/2024 CLINICAL DATA:  COVID diagnosis, headache, body aches, pain with inspiration. History of PE. EXAM: CT ANGIOGRAPHY CHEST WITH CONTRAST TECHNIQUE: Multidetector CT imaging of the chest was performed using the standard protocol during bolus administration of  intravenous contrast. Multiplanar CT image reconstructions and MIPs were obtained to evaluate the vascular anatomy. RADIATION DOSE REDUCTION: This exam was performed according to the departmental dose-optimization program which includes automated exposure control, adjustment of the mA and/or kV according to patient size and/or use of iterative reconstruction technique. CONTRAST:  75mL OMNIPAQUE  IOHEXOL  350 MG/ML SOLN COMPARISON:  06/16/2024. FINDINGS: Cardiovascular: Negative for pulmonary embolus. Heart is mildly enlarged. No pericardial effusion. Mediastinum/Nodes: Thymus in the prevascular space. No pathologically enlarged mediastinal, hilar or axillary lymph nodes. Esophagus is grossly unremarkable. Lungs/Pleura: Lungs are clear. No pleural fluid. Airway is unremarkable. Upper Abdomen: Visualized portions of the liver, adrenal glands, kidneys, spleen, pancreas, stomach and bowel are grossly unremarkable. No upper abdominal adenopathy. Musculoskeletal: None. Review of the MIP images confirms the above findings. IMPRESSION: Negative for pulmonary embolus. Electronically Signed   By: Newell Eke M.D.   On: 07/16/2024 12:52     Procedures   Medications Ordered in the ED  ketorolac  (TORADOL ) 15 MG/ML injection 15 mg (15 mg Intravenous Given 07/16/24 1039)  lactated ringers  bolus 1,000 mL (0 mLs Intravenous Stopped 07/16/24 1235)  iohexol  (OMNIPAQUE ) 350 MG/ML injection 75 mL (75 mLs Intravenous Contrast Given 07/16/24 1202)                                    Medical Decision Making Amount and/or Complexity of Data Reviewed Labs: ordered. Radiology: ordered.  Risk Prescription drug management.   28 year old female with history of PE currently on aspirin who presents emergency department with chest pain, cough, and fever  Initial Ddx:  URI, pleurisy, MI, pericarditis, pneumonia, PE  MDM/Course:  Patient presents emergency department with chest pain, cough, and fever.  Did test positive  for COVID prior to arrival.  On exam is not hypoxic.  She is not in respiratory distress and hemodynamically stable.  Did have a fever.  Lungs are clear to auscultation bilaterally.  No signs of a DVT on her exam.  Underwent a CTA that did not show PE or pneumonia.  EKG and troponin not reflective of MI or pericarditis.  Suspect that she has pleurisy from the COVID.  Since she is not hypoxic and does not have any significant comorbidities do feel she is suitable for outpatient follow-up at this point in time   This patient presents to the ED for concern of complaints listed in HPI, this involves an extensive number of treatment options, and is a complaint that carries with it a high risk of complications and morbidity. Disposition including potential need for admission considered.   Dispo: DC Home. Return precautions discussed  including, but not limited to, those listed in the AVS. Allowed pt time to ask questions which were answered fully prior to dc.  Additional history obtained from sister Records reviewed Outpatient Clinic Notes The following labs were independently interpreted: Chemistry and show no acute abnormality I independently reviewed the following imaging with scope of interpretation limited to determining acute life threatening conditions related to emergency care: CT Chest and agree with the radiologist interpretation with the following exceptions: none I personally reviewed and interpreted cardiac monitoring: normal sinus rhythm  I personally reviewed and interpreted the pt's EKG: see above for interpretation  I have reviewed the patients home medications and made adjustments as needed  Portions of this note were generated with Dragon dictation software. Dictation errors may occur despite best attempts at proofreading.     Final diagnoses:  COVID-19  History of pulmonary embolus (PE)    ED Discharge Orders     None          Yolande Lamar BROCKS, MD 07/16/24 [phone]

## 2024-07-16 NOTE — ED Triage Notes (Signed)
 Pt caox4 reporting COVID+ this morning, headache started a few days ago, bodyaches and pain on respiration started this morning. States she is concerned d/t hx emboli.

## 2024-07-16 NOTE — Telephone Encounter (Signed)
  FYI Only or Action Required?: FYI only for provider.  Patient was last seen in primary care on 03/11/2024 by Knute Thersia Bitters, FNP.  Called Nurse Triage reporting Chest Pain.  Symptoms began several days ago.  Interventions attempted: Rest, hydration, or home remedies.  Symptoms are: gradually worsening.  Triage Disposition: Go to ED Now (or PCP Triage)  Patient/caregiver understands and will follow disposition?: yes         Copied from CRM #[id number]. Topic: Clinical - Red Word Triage >> Jul 16, 2024  8:20 AM Anairis L wrote: [facility] that prompted transfer to Nurse Triage: Mrs.Ryder tested positive today with COVID she is having body aches and chest pain, headache. Reason for Disposition  Taking a deep breath makes pain worse  Answer Assessment - Initial Assessment Questions 1. LOCATION: Where does it hurt?       All over chest front and back /hurts to breathe 2. RADIATION: Does the pain go anywhere else? (e.g., into neck, jaw, arms, back)     *No Answer* 3. ONSET: When did the chest pain begin? (Minutes, hours or days)      *No Answer* 4. PATTERN: Does the pain come and go, or has it been constant since it started?  Does it get worse with exertion?      *No Answer* 5. DURATION: How long does it last (e.g., seconds, minutes, hours)     *No Answer* 6. SEVERITY: How bad is the pain?  (e.g., Scale 1-10; mild, moderate, or severe)     *No Answer* 7. CARDIAC RISK FACTORS: Do you have any history of heart problems or risk factors for heart disease? (e.g., angina, prior heart attack; diabetes, high blood pressure, high cholesterol, smoker, or strong family history of heart disease)     *No Answer* 8. PULMONARY RISK FACTORS: Do you have any history of lung disease?  (e.g., blood clots in lung, asthma, emphysema, birth control pills)     *No Answer* 9. CAUSE: What do you think is causing the chest pain?     *No Answer* 10. OTHER SYMPTOMS: Do you  have any other symptoms? (e.g., dizziness, nausea, vomiting, sweating, fever, difficulty breathing, cough)       Body  11. PREGNANCY: Is there any chance you are pregnant? When was your last menstrual period?       *No Answer*  Protocols used: Chest Pain-A-AH

## 2024-07-22 ENCOUNTER — Other Ambulatory Visit (HOSPITAL_BASED_OUTPATIENT_CLINIC_OR_DEPARTMENT_OTHER): Payer: Self-pay | Admitting: Family Medicine

## 2024-07-22 DIAGNOSIS — F332 Major depressive disorder, recurrent severe without psychotic features: Secondary | ICD-10-CM

## 2024-08-25 ENCOUNTER — Encounter: Payer: Self-pay | Admitting: Radiology

## 2024-09-11 ENCOUNTER — Ambulatory Visit (HOSPITAL_BASED_OUTPATIENT_CLINIC_OR_DEPARTMENT_OTHER): Admitting: Family Medicine

## 2024-09-16 ENCOUNTER — Inpatient Hospital Stay: Admitting: Oncology

## 2024-09-16 ENCOUNTER — Inpatient Hospital Stay: Attending: Oncology

## 2024-09-23 ENCOUNTER — Inpatient Hospital Stay

## 2024-09-23 ENCOUNTER — Inpatient Hospital Stay: Admitting: Oncology

## 2024-09-23 ENCOUNTER — Telehealth: Payer: Self-pay

## 2024-09-23 NOTE — Telephone Encounter (Signed)
 Followed up with phone call to patient and left a voicemail regarding her missed Hematology appt with Dr. Autumn complete with lab work. Patient has missed a few appointments with Dr. Cl[place]is Service.

## 2024-10-29 ENCOUNTER — Ambulatory Visit (HOSPITAL_BASED_OUTPATIENT_CLINIC_OR_DEPARTMENT_OTHER): Admitting: Family Medicine

## 2024-11-12 ENCOUNTER — Ambulatory Visit (HOSPITAL_BASED_OUTPATIENT_CLINIC_OR_DEPARTMENT_OTHER): Admitting: Family Medicine

## 2024-11-24 ENCOUNTER — Other Ambulatory Visit (HOSPITAL_BASED_OUTPATIENT_CLINIC_OR_DEPARTMENT_OTHER): Payer: Self-pay | Admitting: Family Medicine

## 2024-11-24 DIAGNOSIS — F332 Major depressive disorder, recurrent severe without psychotic features: Secondary | ICD-10-CM
